# Patient Record
Sex: Female | Born: 1935 | Race: Black or African American | Hispanic: No | Marital: Married | Smoking: Former smoker
Health system: Southern US, Community
[De-identification: ages and names within clinical notes are randomized; demographics above are authoritative.]

## PROBLEM LIST (undated history)

## (undated) DIAGNOSIS — N189 Chronic kidney disease, unspecified: Secondary | ICD-10-CM

## (undated) DIAGNOSIS — I10 Essential (primary) hypertension: Secondary | ICD-10-CM

## (undated) DIAGNOSIS — J309 Allergic rhinitis, unspecified: Secondary | ICD-10-CM

## (undated) DIAGNOSIS — IMO0002 Reserved for concepts with insufficient information to code with codable children: Secondary | ICD-10-CM

## (undated) DIAGNOSIS — I739 Peripheral vascular disease, unspecified: Secondary | ICD-10-CM

## (undated) DIAGNOSIS — M1990 Unspecified osteoarthritis, unspecified site: Secondary | ICD-10-CM

## (undated) DIAGNOSIS — E559 Vitamin D deficiency, unspecified: Secondary | ICD-10-CM

## (undated) DIAGNOSIS — E119 Type 2 diabetes mellitus without complications: Secondary | ICD-10-CM

## (undated) DIAGNOSIS — I6529 Occlusion and stenosis of unspecified carotid artery: Secondary | ICD-10-CM

## (undated) DIAGNOSIS — I359 Nonrheumatic aortic valve disorder, unspecified: Secondary | ICD-10-CM

## (undated) DIAGNOSIS — E785 Hyperlipidemia, unspecified: Secondary | ICD-10-CM

## (undated) DIAGNOSIS — R229 Localized swelling, mass and lump, unspecified: Secondary | ICD-10-CM

## (undated) DIAGNOSIS — I517 Cardiomegaly: Secondary | ICD-10-CM

## (undated) HISTORY — DX: Nonrheumatic aortic valve disorder, unspecified: I35.9

## (undated) HISTORY — DX: Peripheral vascular disease, unspecified: I73.9

## (undated) HISTORY — DX: Essential (primary) hypertension: I10

## (undated) HISTORY — DX: Hyperlipidemia, unspecified: E78.5

## (undated) HISTORY — DX: Occlusion and stenosis of unspecified carotid artery: I65.29

## (undated) HISTORY — DX: Unspecified osteoarthritis, unspecified site: M19.90

## (undated) HISTORY — DX: Chronic kidney disease, unspecified: N18.9

## (undated) HISTORY — DX: Cardiomegaly: I51.7

## (undated) HISTORY — DX: Localized swelling, mass and lump, unspecified: R22.9

## (undated) HISTORY — DX: Vitamin D deficiency, unspecified: E55.9

## (undated) HISTORY — DX: Type 2 diabetes mellitus without complications: E11.9

## (undated) HISTORY — PX: ABDOMINAL HYSTERECTOMY: SHX81

## (undated) HISTORY — DX: Reserved for concepts with insufficient information to code with codable children: IMO0002

## (undated) HISTORY — DX: Allergic rhinitis, unspecified: J30.9

---

## 2012-04-21 LAB — PULMONARY FUNCTION TEST

## 2012-05-12 ENCOUNTER — Institutional Professional Consult (permissible substitution): Payer: Self-pay | Admitting: Pulmonary Disease

## 2012-05-25 ENCOUNTER — Encounter: Payer: Self-pay | Admitting: Pulmonary Disease

## 2012-05-26 ENCOUNTER — Encounter: Payer: Self-pay | Admitting: Pulmonary Disease

## 2012-05-26 ENCOUNTER — Ambulatory Visit (INDEPENDENT_AMBULATORY_CARE_PROVIDER_SITE_OTHER): Payer: Medicare Other | Admitting: Pulmonary Disease

## 2012-05-26 VITALS — BP 140/90 | HR 62 | Temp 97.8°F | Ht 62.0 in | Wt 156.0 lb

## 2012-05-26 DIAGNOSIS — I272 Pulmonary hypertension, unspecified: Secondary | ICD-10-CM | POA: Insufficient documentation

## 2012-05-26 DIAGNOSIS — I2789 Other specified pulmonary heart diseases: Secondary | ICD-10-CM

## 2012-05-26 NOTE — Patient Instructions (Addendum)
Your lung function is good We will review your echo test from [facility]

## 2012-05-26 NOTE — Assessment & Plan Note (Signed)
She does not seem to have intrinsic lung disease. PHT is mild on rpt echo , per [facility] notes, I am waiting on official report. At any rate, she is quite asymptomatic. She does have systemic hypertension & may have diastolic dysfunction on that basis. I do not feel that a right heart cath is indicated here. We should focus on controlling her Bp. In fact a screening sleep study may b eindicated since she does require multiple meds for hypertension.

## 2012-05-26 NOTE — Progress Notes (Signed)
  Subjective:    Patient ID: Whitney Ramirez, female    DOB: 08-02-1930, 79 y.o.   MRN: [id number]  HPI PCP - Lollie Marrow  79 y.o never smoker referred for evaluation of pulmonary hypertension. She does seem to have systemic hypertension that needs at least 4 medications to control. An echo suggested severe pulmonary hypertension but rpt echo in 2014  only showed mild pulmonary hypertension. She denies dyspnea on exertion, cough, wheeze or recurrent chest colds. In fact , she [place] in the mall about 1 mile daily am. She denies pedal edema, pND or orthopnea There is no personal or F/h/o thromboembolic disease Spirometry showed no airway obstruction with fev1 of 1.77 (116%) & FVC of 2.15 (104%) & ratio of 82   Past Medical History  Diagnosis Date  . Peripheral vascular disease   . Mass   . Aortic valve disorder   . Vitamin D deficiency   . Hypertension   . LVH (left ventricular hypertrophy)   . Occlusion and stenosis of carotid artery   . Hyperlipemia   . Allergic rhinitis   . Diabetes mellitus   . CKD (chronic kidney disease)     Past Surgical History  Procedure Laterality Date  . Abdominal hysterectomy      No Known Allergies  History   Social History  . Marital Status: Married    Spouse Name: N/A    Number of Children: N/A  . Years of Education: N/A   Occupational History  . Not on file.   Social History Main Topics  . Smoking status: Never Smoker   . Smokeless tobacco: Not on file  . Alcohol Use: No  . Drug Use: No  . Sexually Active: Not on file   Other Topics Concern  . Not on file   Social History Narrative  . No narrative on file    Family History  Problem Relation Age of Onset  . Heart disease Father   . Diabetes Father   . Diabetes Mother   . Hypertension Mother   . Hypertension Father      Review of Systems  Constitutional: Negative for fever and unexpected weight change.  HENT: Negative for ear pain, nosebleeds, congestion, sore  throat, rhinorrhea, sneezing, trouble swallowing, dental problem, postnasal drip and sinus pressure.   Eyes: Negative for redness and itching.  Respiratory: Negative for cough, chest tightness, shortness of breath and wheezing.   Cardiovascular: Negative for palpitations and leg swelling.  Gastrointestinal: Negative for nausea and vomiting.  Genitourinary: Negative for dysuria.  Musculoskeletal: Negative for joint swelling.  Skin: Negative for rash.  Neurological: Negative for headaches.  Hematological: Does not bruise/bleed easily.  Psychiatric/Behavioral: Negative for dysphoric mood. The patient is not nervous/anxious.        Objective:   Physical Exam Gen. Pleasant, well-nourished, in no distress, normal affect ENT - no lesions, no post nasal drip Neck: No JVD, no thyromegaly, no carotid bruits Lungs: no use of accessory muscles, no dullness to percussion, clear without rales or rhonchi  Cardiovascular: Rhythm regular, heart sounds  normal, no murmurs or gallops, no peripheral edema Abdomen: soft and non-tender, no hepatosplenomegaly, BS normal. Musculoskeletal: No deformities, no cyanosis or clubbing Neuro:  alert, non focal         Assessment & Plan:

## 2017-05-11 DIAGNOSIS — G4733 Obstructive sleep apnea (adult) (pediatric): Secondary | ICD-10-CM | POA: Insufficient documentation

## 2017-05-14 DIAGNOSIS — I480 Paroxysmal atrial fibrillation: Secondary | ICD-10-CM | POA: Insufficient documentation

## 2017-07-04 ENCOUNTER — Ambulatory Visit: Payer: Medicare Other | Admitting: Family Medicine

## 2017-07-04 ENCOUNTER — Encounter: Payer: Self-pay | Admitting: Family Medicine

## 2017-07-04 ENCOUNTER — Ambulatory Visit (INDEPENDENT_AMBULATORY_CARE_PROVIDER_SITE_OTHER): Payer: Medicare HMO | Admitting: Family Medicine

## 2017-07-04 VITALS — BP 160/68 | HR 73 | Temp 98.3°F | Resp 16 | Ht 62.0 in | Wt 139.0 lb

## 2017-07-04 DIAGNOSIS — I10 Essential (primary) hypertension: Secondary | ICD-10-CM | POA: Insufficient documentation

## 2017-07-04 DIAGNOSIS — N3941 Urge incontinence: Secondary | ICD-10-CM

## 2017-07-04 MED ORDER — LISINOPRIL 10 MG PO TABS: 10.0000 mg | ORAL_TABLET | Freq: Every day | ORAL | 3 refills | Status: DC

## 2017-07-04 MED ORDER — MIRABEGRON ER 25 MG PO TB24: 25.0000 mg | ORAL_TABLET | Freq: Every day | ORAL | 1 refills | Status: DC

## 2017-07-04 MED ORDER — LISINOPRIL 10 MG PO TABS: 10.0000 mg | ORAL_TABLET | Freq: Every day | ORAL | 0 refills | Status: DC

## 2017-07-04 NOTE — Patient Instructions (Addendum)
We will hold off on the urology referral for now.  If the medicine for your urinary issue is too expensive, don't fill it and let me know.   Consider a wrist cock up splint. This can be found at [facility] or online.   For now, we are changing your hydralazine to lisinopril. I think this can help with a lot of issues, but if I see anything in your records that would make [place] change this, I will let you know.   Let us know if you need anything.

## 2017-07-04 NOTE — Progress Notes (Signed)
Chief Complaint  Patient presents with  . New Patient (Initial Visit)    "Just wanting one doctor" needing urology referral  . Hypertension  . Hyperlipidemia       New Patient Visit SUBJECTIVE: HPI: Whitney Ramirez is an 84 y.o.female who is being seen for establishing care.  The patient was previously seen at [facility].  4-5 years, will have immediate urge to urinate and will lose control. She has been using pads.  She does not consume a copious amount of caffeine or alcohol.  She does not drink large amounts of fluids.  She denies any bleeding, pain, fevers, or discharge.  She has never been on any medication for this issue.  Hypertension Patient presents for hypertension follow up. She does monitor home blood pressures. Blood pressures ranging on average from 140's/60-70's. She is compliant with medication- hydralazine 50 mg tid, labetalol 100 mg/d. Patient has these side effects of medication: none (is having LE swelling) She is adhering to a healthy diet overall. Exercise: fishing, gardening   No Known Allergies  Past Medical History:  Diagnosis Date  . Allergic rhinitis   . Aortic valve disorder   . Arthritis   . CKD (chronic kidney disease)   . Diabetes mellitus (HCC)   . Hyperlipemia   . Hypertension   . LVH (left ventricular hypertrophy)   . Mass   . Occlusion and stenosis of carotid artery   . Peripheral vascular disease (HCC)   . Vitamin D deficiency    Past Surgical History:  Procedure Laterality Date  . ABDOMINAL HYSTERECTOMY     Family History  Problem Relation Age of Onset  . Heart disease Father   . Diabetes Father   . Hypertension Father   . Diabetes Mother   . Hypertension Mother    No Known Allergies  Current Outpatient Medications:  .  atorvastatin (LIPITOR) 40 MG tablet, Take by mouth., Disp: , Rfl:  .  Calcium Carbonate-Vitamin D (CALTRATE 600+D) 600-400 MG-UNIT per chew tablet, Chew 1 tablet by mouth 2 (two) times daily., Disp: ,  Rfl:  .  COD LIVER OIL PO, Take by mouth daily., Disp: , Rfl:  .  fish oil-omega-3 fatty acids 1000 MG capsule, Take 1 g by mouth daily., Disp: , Rfl:  .  labetalol (NORMODYNE) 100 MG tablet, Take by mouth., Disp: , Rfl:  .  Multiple Vitamins-Minerals (MULTIVITAMIN PO), Take by mouth daily., Disp: , Rfl:  .  omeprazole (PRILOSEC) 40 MG capsule, , Disp: , Rfl:  .  lisinopril (PRINIVIL,ZESTRIL) 10 MG tablet, Take 1 tablet (10 mg total) by mouth daily., Disp: 30 tablet, Rfl: 0 .  lisinopril (PRINIVIL,ZESTRIL) 10 MG tablet, Take 1 tablet (10 mg total) by mouth daily., Disp: 90 tablet, Rfl: 3 .  mirabegron ER (MYRBETRIQ) 25 MG TB24 tablet, Take 1 tablet (25 mg total) by mouth daily., Disp: 30 tablet, Rfl: 1   ROS Cardiovascular: Denies chest pain  Respiratory: Denies dyspnea   OBJECTIVE: BP (!) 160/68 (BP Location: Left Arm, Patient Position: Sitting, Cuff Size: Normal)   Pulse 73   Temp 98.3 F (36.8 C) (Oral)   Resp 16   Ht  (1.575 m)   Wt 139 lb (63 kg)   SpO2 98%   BMI 25.42 kg/m   Constitutional: -  VS reviewed -  Well developed, well nourished, appears stated age -  No apparent distress  Psychiatric: -  Oriented to person, place, and time -  Memory intact -  Affect and mood normal -  Fluent conversation, good eye contact -  Judgment and insight age appropriate  Eye: -  Conjunctivae clear, no discharge -  Pupils symmetric, round, reactive to light  ENMT: -  MMM    Pharynx moist, no exudate, no erythema  Neck: -  No gross swelling, no palpable masses -  Thyroid midline, not enlarged, mobile, no palpable masses  Cardiovascular: -  RRR -  + Bilateral 2+ pitting edema tapering to the proximal third of tibia bilaterally  Respiratory: -  Normal respiratory effort, no accessory muscle use, no retraction -  Breath sounds equal, no wheezes, no ronchi, no crackles  Musculoskeletal: -  No clubbing, no cyanosis -  Gait normal  Skin: -  No significant lesion on inspection -   Warm and dry to palpation   ASSESSMENT/PLAN: Urge incontinence of urine - Plan: mirabegron ER (MYRBETRIQ) 25 MG TB24 tablet  Essential hypertension - Plan: labetalol (NORMODYNE) 100 MG tablet, lisinopril (PRINIVIL,ZESTRIL) 10 MG tablet  Patient instructed to sign release of records form from her previous PCP. Start above medicine.  Let us know if the medicine is too expensive and we will see if we can find an alternative.  We can always do the referral in the future, she wishes to try this first. Stop hydralazine given the swelling in her lower extremities and preference for once a day medicine rather than 3 times a day.  Continue to check blood pressure at home.  Counseled on diet and exercise. Patient should return in 1 mo to check urinary issue.  Since we are changing her hydralazine to lisinopril, we will also follow-up on her blood pressure.  I would plan to see her in 3 months for a diabetes check pending review of her labs. The patient voiced understanding and agreement to the plan.   Jilda Roche Blomkest, DO 07/04/17  8:48 AM

## 2017-07-16 ENCOUNTER — Telehealth: Payer: Self-pay | Admitting: *Deleted

## 2017-07-16 DIAGNOSIS — D508 Other iron deficiency anemias: Secondary | ICD-10-CM

## 2017-07-16 NOTE — Addendum Note (Signed)
Addended by: Scharlene Gloss B on: 07/16/2017 12:28 PM   Modules accepted: Orders

## 2017-07-16 NOTE — Telephone Encounter (Signed)
Copied from CRM [id number]. Topic: Appointment Scheduling - Scheduling Inquiry for Clinic >> Jul 16, 2017 10:49 AM Landry Mellow wrote: Reason for CRM: annie legrand ( daughter) called - she is requesting to have a hemoglobin test ran. Pt will not have a test ran under Dr Nedra Hai care and the results will not be back for 2 weeks. Please call daughter if orders will be put in at [phone] and also call pt at [phone]

## 2017-07-16 NOTE — Telephone Encounter (Signed)
OK, dx anemia.

## 2017-07-16 NOTE — Telephone Encounter (Signed)
Lab ordered. Called the daughter left message/==did however call the patient and spoke to her/scheduled an appt for this lab in the morning 07/17/2017 at 10:30---daughter did call back and informed her of appt.

## 2017-07-17 ENCOUNTER — Other Ambulatory Visit (INDEPENDENT_AMBULATORY_CARE_PROVIDER_SITE_OTHER): Payer: Medicare HMO

## 2017-07-17 DIAGNOSIS — D508 Other iron deficiency anemias: Secondary | ICD-10-CM

## 2017-07-18 LAB — HEMOGLOBIN: Hemoglobin: 8.4 g/dL — ABNORMAL LOW (ref 12.0–15.0)

## 2017-07-18 NOTE — Telephone Encounter (Signed)
Daughter calling checking to see if results are back yet from Dr Nedra Hai, Call back [phone]

## 2017-07-21 ENCOUNTER — Other Ambulatory Visit: Payer: Self-pay

## 2017-07-21 ENCOUNTER — Ambulatory Visit: Payer: Self-pay | Admitting: *Deleted

## 2017-07-21 ENCOUNTER — Ambulatory Visit (HOSPITAL_BASED_OUTPATIENT_CLINIC_OR_DEPARTMENT_OTHER)
Admission: RE | Admit: 2017-07-21 | Discharge: 2017-07-21 | Disposition: A | Discharge status: Home / Self Care | Payer: Medicare HMO | Source: Ambulatory Visit | Attending: Internal Medicine | Admitting: Internal Medicine

## 2017-07-21 ENCOUNTER — Encounter: Payer: Self-pay | Admitting: Internal Medicine

## 2017-07-21 ENCOUNTER — Telehealth: Payer: Self-pay | Admitting: Family Medicine

## 2017-07-21 ENCOUNTER — Encounter: Payer: Self-pay | Admitting: *Deleted

## 2017-07-21 ENCOUNTER — Ambulatory Visit (INDEPENDENT_AMBULATORY_CARE_PROVIDER_SITE_OTHER): Payer: Medicare HMO | Admitting: Internal Medicine

## 2017-07-21 VITALS — BP 152/72 | HR 90 | Temp 98.9°F | Ht 62.0 in | Wt 136.2 lb

## 2017-07-21 DIAGNOSIS — I480 Paroxysmal atrial fibrillation: Secondary | ICD-10-CM | POA: Diagnosis not present

## 2017-07-21 DIAGNOSIS — D500 Iron deficiency anemia secondary to blood loss (chronic): Secondary | ICD-10-CM

## 2017-07-21 DIAGNOSIS — M7731 Calcaneal spur, right foot: Secondary | ICD-10-CM | POA: Insufficient documentation

## 2017-07-21 DIAGNOSIS — M7989 Other specified soft tissue disorders: Secondary | ICD-10-CM | POA: Insufficient documentation

## 2017-07-21 NOTE — Patient Instructions (Signed)
Please go downstairs and get your x-ray of the ankle.  You also need a ultrasound of the R leg  to be done today.  We will call you with the results  If you have chest pain, difficulty breathing, increased swelling, fever, chills, increased ankle pain: Please go to the ER

## 2017-07-21 NOTE — Telephone Encounter (Signed)
Pt's daughter given results per notes of Dr. Carmelia Roller on 07/19/17, daughter verbalized understanding. Unable to document in result note due to result note not being routed to [facility].

## 2017-07-21 NOTE — Telephone Encounter (Signed)
Called pt due to complaints of right leg and swelling which started is 07/20/17; she also reports pain to the area; recommendations made per nurse triage to include seeing a physician within 24 hours; pt normally sees Dr Carmelia Roller but he has no availability within parameters per protocol; pt offered and accepted appointment with Dr Drue Novel, [facility] 07/21/17 at 1500; she verbalizes understanding; will route to office for notification of this upcoming appointment.  Reason for Disposition . [1] MODERATE leg swelling (e.g., swelling extends up to knees) AND [2] new onset or worsening  Answer Assessment - Initial Assessment Questions 1. ONSET: "When did the swelling start?" (e.g., minutes, hours, days)     07/21/17 2. LOCATION: "What part of the leg is swollen?"  "Are both legs swollen or just one leg?"     right 3. SEVERITY: "How bad is the swelling?" (e.g., localized; mild, moderate, severe)  - Localized - small area of swelling localized to one leg  - MILD pedal edema - swelling limited to foot and ankle, pitting edema < 1/4 inch (6 mm) deep, rest and elevation eliminate most or all swelling  - MODERATE edema - swelling of lower leg to knee, pitting edema > 1/4 inch (6 mm) deep, rest and elevation only partially reduce swelling  - SEVERE edema - swelling extends above knee, facial or hand swelling present      moderate 4. REDNESS: "Does the swelling look red or infected?"     no 5. PAIN: "Is the swelling painful to touch?" If so, ask: "How painful is it?"   (Scale 1-10; mild, moderate or severe)     Mild to moderate 6. FEVER: "Do you have a fever?" If so, ask: "What is it, how was it measured, and when did it start?"     no 7. CAUSE: "What do you think is causing the leg swelling?"    Unsure; maybe on there too mucj 8. MEDICAL HISTORY: "Do you have a history of heart failure, kidney disease, liver failure, or cancer?"    hypertension 9. RECURRENT SYMPTOM: "Have you had leg swelling before?" If  so, ask: "When was the last time?" "What happened that time?"     no 10. OTHER SYMPTOMS: "Do you have any other symptoms?" (e.g., chest pain, difficulty breathing)       no 11. PREGNANCY: "Is there any chance you are pregnant?" "When was your last menstrual period?"       no  Protocols used: LEG SWELLING AND EDEMA-A-AH

## 2017-07-21 NOTE — Telephone Encounter (Signed)
Charted in error This encounter was created in error - please disregard. 

## 2017-07-21 NOTE — Telephone Encounter (Signed)
FYI

## 2017-07-21 NOTE — Progress Notes (Signed)
Pre visit review using our clinic review tool, if applicable. No additional management support is needed unless otherwise documented below in the visit note. 

## 2017-07-21 NOTE — Progress Notes (Signed)
Subjective:    Patient ID: Whitney Ramirez, female    DOB: 23-Apr-1930, 84 y.o.   MRN: [id number]  DOS:  07/21/2017 Type of visit - description : acute Interval history: Here with her daughter Her main concern is right leg swelling. The patient did have a somewhat prolonged car ride 07/18/2017, ~ 1.5 hours Developed right leg swelling without pain 07/20/2017. The swelling today is slightly worse. She was recently admitted to the hospital with severe anemia. I noticed some warmness of the right ankle, she denies fever or chills.  She has bilateral ankle pain as baseline   Review of Systems No fever chills Denies chest pain or difficulty breathing. No nausea, vomiting, diarrhea.  No recent blood in the stools.  Past Medical History:  Diagnosis Date  . Allergic rhinitis   . Aortic valve disorder   . Arthritis   . CKD (chronic kidney disease)   . Diabetes mellitus (HCC)   . Hyperlipemia   . Hypertension   . LVH (left ventricular hypertrophy)   . Mass   . Occlusion and stenosis of carotid artery   . Peripheral vascular disease (HCC)   . Vitamin D deficiency     Past Surgical History:  Procedure Laterality Date  . ABDOMINAL HYSTERECTOMY      Social History   Socioeconomic History  . Marital status: Married    Spouse name: Not on file  . Number of children: Not on file  . Years of education: Not on file  . Highest education level: Not on file  Occupational History  . Not on file  Social Needs  . Financial resource strain: Not on file  . Food insecurity:    Worry: Not on file    Inability: Not on file  . Transportation needs:    Medical: Not on file    Non-medical: Not on file  Tobacco Use  . Smoking status: Former Smoker    Types: Cigarettes  . Smokeless tobacco: Never Used  Substance and Sexual Activity  . Alcohol use: No  . Drug use: No  . Sexual activity: Not on file  Lifestyle  . Physical activity:    Days per week: Not on file    Minutes per session:  Not on file  . Stress: Not on file  Relationships  . Social connections:    Talks on phone: Not on file    Gets together: Not on file    Attends religious service: Not on file    Active member of club or organization: Not on file    Attends meetings of clubs or organizations: Not on file    Relationship status: Not on file  . Intimate partner violence:    Fear of current or ex partner: Not on file    Emotionally abused: Not on file    Physically abused: Not on file    Forced sexual activity: Not on file  Other Topics Concern  . Not on file  Social History Narrative  . Not on file      Allergies as of 07/21/2017   No Known Allergies     Medication List        Accurate as of 07/21/17 11:59 PM. Always use your most recent med list.          atorvastatin 40 MG tablet Commonly known as:  LIPITOR Take by mouth.   CALTRATE 600+D 600-400 MG-UNIT chew tablet Generic drug:  Calcium Carbonate-Vitamin D Chew 1 tablet by mouth 2 (two)  times daily.   COD LIVER OIL PO Take by mouth daily.   fish oil-omega-3 fatty acids 1000 MG capsule Take 1 g by mouth daily.   labetalol 100 MG tablet Commonly known as:  NORMODYNE Take by mouth.   lisinopril 10 MG tablet Commonly known as:  PRINIVIL,ZESTRIL Take 1 tablet (10 mg total) by mouth daily.   mirabegron ER 25 MG Tb24 tablet Commonly known as:  MYRBETRIQ Take 1 tablet (25 mg total) by mouth daily.   MULTIVITAMIN PO Take by mouth daily.   omeprazole 40 MG capsule Commonly known as:  PRILOSEC          Objective:   Physical Exam BP (!) 152/72 (BP Location: Left Arm, Patient Position: Sitting, Cuff Size: Normal)   Pulse 90   Temp 98.9 F (37.2 C) (Oral)   Ht  (1.575 m)   Wt 136 lb 4 oz (61.8 kg)   SpO2 96%   BMI 24.92 kg/m  General:   Well developed, well nourished man sitting in a wheelchair in no apparent distress HEENT:  Normocephalic . Face symmetric, atraumatic Lungs:  CTA B Normal respiratory  effort, no intercostal retractions, no accessory muscle use. Heart: Seems irregular .  Extremities: Right leg is larger: 35 versus 32 cm in diameter in the calf 26 versus 23 cm distal from the calf Calves are soft, nontender.  No redness or warmness. Ankles: Range of motion decreased bilaterally, slightly warm on the right.  No redness.  Not particularly TTP. Skin: Not pale. Not jaundice Neurologic:  alert & oriented X3.  Speech normal, gait not tested Psych--  Cognition and judgment appear intact.  Cooperative with normal attention span and concentration.  Behavior appropriate. No anxious or depressed appearing.      Assessment & Plan:   84 y/o female whose PMH include HTN, OSA, paroxysmal A. 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  Presents with:  Right leg edema: Acute, she did have a somewhat prolonged car ride 48 hours prior to the onset of the swelling.  Leg is indeed swollen but the calf is soft and nontender. Check a ultrasound, if it shows a DVT I will direct the patient to the ER for consideration of a inferior vena cava filter, I believe anticoagulation will be very risky in her situation. Addendum: [place] (-) for DVT, I personally communicated that to the patient.  Recommend leg elevation.  Call if swelling increases, or she has chest pain or difficulty breathing. Atrial fibrillation: Not anticoagulated Profound anemia: Follow-up by GI at [facility], her last hemoglobin was a stable.  Currently with no GI symptoms. Pt would like to transfer to [facility].  Will let PCP handle that issue.  Until then she must follow up with [facility]. Ankle pain: We will get an x-ray, joint is a slightly warm, no effusion, no fever chills.  Consider get a uric acid, gout? Of  note: I  was unable to review labs at care everywhere.  Today, I spent more than 40 min with the patient: >50% of the time counseling regards her acute problem which is lower extremity edema, also doing extensive chart review since she is a new patient to me. I also communicated personally the results of the ultrasound.

## 2017-07-23 DIAGNOSIS — D649 Anemia, unspecified: Secondary | ICD-10-CM | POA: Insufficient documentation

## 2017-08-04 ENCOUNTER — Ambulatory Visit: Payer: Medicare HMO | Admitting: Family Medicine

## 2017-08-13 ENCOUNTER — Ambulatory Visit (INDEPENDENT_AMBULATORY_CARE_PROVIDER_SITE_OTHER): Payer: Medicare HMO | Admitting: Family Medicine

## 2017-08-13 ENCOUNTER — Encounter: Payer: Self-pay | Admitting: Family Medicine

## 2017-08-13 VITALS — BP 172/70 | HR 78 | Temp 97.7°F | Ht 62.0 in | Wt 137.0 lb

## 2017-08-13 DIAGNOSIS — N3941 Urge incontinence: Secondary | ICD-10-CM

## 2017-08-13 DIAGNOSIS — R600 Localized edema: Secondary | ICD-10-CM | POA: Diagnosis not present

## 2017-08-13 DIAGNOSIS — D509 Iron deficiency anemia, unspecified: Secondary | ICD-10-CM

## 2017-08-13 DIAGNOSIS — I10 Essential (primary) hypertension: Secondary | ICD-10-CM

## 2017-08-13 MED ORDER — MIRABEGRON ER 25 MG PO TB24: 25.0000 mg | ORAL_TABLET | Freq: Every day | ORAL | 1 refills | Status: DC

## 2017-08-13 MED ORDER — LISINOPRIL 20 MG PO TABS: 20.0000 mg | ORAL_TABLET | Freq: Every day | ORAL | 1 refills | Status: DC

## 2017-08-13 NOTE — Progress Notes (Signed)
Chief Complaint  Patient presents with  . Follow-up    Subjective: Patient is a 84 y.o. female here for med f/u.  Patient was seen around 1 month ago and diagnosed with incontinence.  She had been on several anticholinergics, but had never been tried on Myrbetriq.  We tried to call it in, however she never picked it up.  She continues to have urinary frequency at night.  Hypertension Patient presents for hypertension follow up. She does monitor home blood pressures. Blood pressures ranging on average from 140's/60's. She is compliant with medications. Patient has these side effects of medication: none She is adhering to a healthy diet overall. Exercise: Not much exercise/physical activity recently  The patient has a history of iron deficiency anemia.  She was seeing gastroenterology, however after an unremarkable work-up, she was just seeing them for iron infusions.  She would [facility].  She feels much better since starting her infusions.  She continues to have swelling in her legs.  She was seen several weeks ago and had a negative work-up for DVT.  She was stopped on hydralazine.  No calf pain or shortness of breath.  She does not remember when her last echo was.  ROS: Heart: Denies chest pain  Lungs: Denies SOB   Family History  Problem Relation Age of Onset  . Heart disease Father   . Diabetes Father   . Hypertension Father   . Diabetes Mother   . Hypertension Mother    Past Medical History:  Diagnosis Date  . Allergic rhinitis   . Aortic valve disorder   . Arthritis   . CKD (chronic kidney disease)   . Diabetes mellitus (HCC)   . Hyperlipemia   . Hypertension   . LVH (left ventricular hypertrophy)   . Mass   . Occlusion and stenosis of carotid artery   . Peripheral vascular disease (HCC)   . Vitamin D deficiency    No Known Allergies  Current Outpatient Medications:  .  atorvastatin (LIPITOR) 40 MG tablet, Take by mouth., Disp: ,  Rfl:  .  Calcium Carbonate-Vitamin D (CALTRATE 600+D) 600-400 MG-UNIT per chew tablet, Chew 1 tablet by mouth 2 (two) times daily., Disp: , Rfl:  .  COD LIVER OIL PO, Take by mouth daily., Disp: , Rfl:  .  fish oil-omega-3 fatty acids 1000 MG capsule, Take 1 g by mouth daily., Disp: , Rfl:  .  labetalol (NORMODYNE) 100 MG tablet, Take by mouth., Disp: , Rfl:  .  lisinopril (PRINIVIL,ZESTRIL) 20 MG tablet, Take 1 tablet (20 mg total) by mouth daily., Disp: 90 tablet, Rfl: 1 .  mirabegron ER (MYRBETRIQ) 25 MG TB24 tablet, Take 1 tablet (25 mg total) by mouth daily., Disp: 30 tablet, Rfl: 1 .  Multiple Vitamins-Minerals (MULTIVITAMIN PO), Take by mouth daily., Disp: , Rfl:  .  omeprazole (PRILOSEC) 40 MG capsule, , Disp: , Rfl:   Objective: BP (!) 172/70 (BP Location: Left Arm, Patient Position: Sitting, Cuff Size: Normal)   Pulse 78   Temp 97.7 F (36.5 C) (Oral)   Ht 5\' 2"  (1.575 m)   Wt 137 lb (62.1 kg)   SpO2 99%   BMI 25.06 kg/m  General: Awake, appears stated age HEENT: MMM, EOMi Heart: RRR, 2+ LE edema b/l Lungs: CTAB, no rales, wheezes or rhonchi. No accessory muscle use Abd: BS+, soft, NT, ND, no masses or organomegaly Psych: Age appropriate judgment and insight, normal affect and mood  Assessment  and Plan: Essential hypertension - Plan: ECHOCARDIOGRAM COMPLETE, lisinopril (PRINIVIL,ZESTRIL) 20 MG tablet  Urge incontinence of urine - Plan: mirabegron ER (MYRBETRIQ) 25 MG TB24 tablet  Iron deficiency anemia, unspecified iron deficiency anemia type - Plan: Ambulatory referral to Hematology  Bilateral leg edema - Plan: ECHOCARDIOGRAM COMPLETE  Orders as above. Increase dose of ACEi from 10 mg/d to 20 mg/d. Ck Echo. Call in Myrbetriq again. Refer to [facility] hematology for Fe infusions per pt request. F/u in 6 weeks to reck BP.  The patient voiced understanding and agreement to the plan.  Jilda Roche Fleming, DO 08/13/17  12:36 PM

## 2017-08-13 NOTE — Patient Instructions (Addendum)
OK to start exercising/getting physically active again. Elevate the legs. Mind the salt intake.   If you do not hear anything about your referral in the next 1-2 weeks, call our office and ask for an update.  Continue checking blood pressure at home.  [facility] too [facility], let me know.  Let us know if you need anything.

## 2017-08-13 NOTE — Progress Notes (Signed)
Pre visit review using our clinic review tool, if applicable. No additional management support is needed unless otherwise documented below in the visit note. 

## 2017-08-14 ENCOUNTER — Other Ambulatory Visit: Payer: Self-pay | Admitting: Family Medicine

## 2017-08-14 DIAGNOSIS — N3941 Urge incontinence: Secondary | ICD-10-CM

## 2017-08-14 MED ORDER — MIRABEGRON ER 25 MG PO TB24: 25.0000 mg | ORAL_TABLET | Freq: Every day | ORAL | 1 refills | Status: DC

## 2017-08-14 NOTE — Progress Notes (Signed)
[facility] called to mail order. Pt notified.

## 2017-08-28 ENCOUNTER — Ambulatory Visit (HOSPITAL_BASED_OUTPATIENT_CLINIC_OR_DEPARTMENT_OTHER)
Admission: RE | Admit: 2017-08-28 | Discharge: 2017-08-28 | Disposition: A | Discharge status: Home / Self Care | Payer: Medicare HMO | Source: Ambulatory Visit | Attending: Family Medicine | Admitting: Family Medicine

## 2017-08-28 DIAGNOSIS — I10 Essential (primary) hypertension: Secondary | ICD-10-CM

## 2017-08-28 DIAGNOSIS — I700 Atherosclerosis of aorta: Secondary | ICD-10-CM | POA: Insufficient documentation

## 2017-08-28 DIAGNOSIS — I119 Hypertensive heart disease without heart failure: Secondary | ICD-10-CM | POA: Diagnosis not present

## 2017-08-28 DIAGNOSIS — I348 Other nonrheumatic mitral valve disorders: Secondary | ICD-10-CM | POA: Insufficient documentation

## 2017-08-28 DIAGNOSIS — R609 Edema, unspecified: Secondary | ICD-10-CM | POA: Insufficient documentation

## 2017-08-28 DIAGNOSIS — I4891 Unspecified atrial fibrillation: Secondary | ICD-10-CM | POA: Diagnosis not present

## 2017-08-28 DIAGNOSIS — R600 Localized edema: Secondary | ICD-10-CM

## 2017-09-11 ENCOUNTER — Other Ambulatory Visit (HOSPITAL_BASED_OUTPATIENT_CLINIC_OR_DEPARTMENT_OTHER): Payer: Medicare HMO

## 2017-09-18 ENCOUNTER — Inpatient Hospital Stay: Payer: Medicare HMO

## 2017-09-18 ENCOUNTER — Inpatient Hospital Stay: Payer: Medicare HMO | Attending: Hematology & Oncology | Admitting: Hematology & Oncology

## 2017-09-24 ENCOUNTER — Other Ambulatory Visit: Payer: Self-pay

## 2017-09-24 ENCOUNTER — Ambulatory Visit (INDEPENDENT_AMBULATORY_CARE_PROVIDER_SITE_OTHER): Payer: Medicare HMO | Admitting: Family Medicine

## 2017-09-24 VITALS — BP 137/71 | HR 136

## 2017-09-24 DIAGNOSIS — I10 Essential (primary) hypertension: Secondary | ICD-10-CM | POA: Diagnosis not present

## 2017-09-24 MED ORDER — LABETALOL HCL 100 MG PO TABS: 100.0000 mg | ORAL_TABLET | Freq: Once | ORAL | 0 refills | Status: DC

## 2017-09-24 MED ORDER — LABETALOL HCL 100 MG PO TABS: 100.0000 mg | ORAL_TABLET | Freq: Once | ORAL | 1 refills | Status: DC

## 2017-09-24 NOTE — Progress Notes (Signed)
Pre visit review using our clinic tool,if applicable. No additional management support is needed unless otherwise documented below in the visit note.   Pt here for Blood pressure check per order from Dr. Carmelia RollerWendling.  Pt currently takes: Lisinopril 20 mg daily. Lebatelol 100 mg daily ordered however patient has not been taking. Ordered from pharmacy so patient can start. No complaints voiced today.   BP today  = 137/71 P = 136  Pt advised per restart Lebatelol 100 mg and continue Lisinopril 20 mg. Return for office visit with provider in 1 month. Appointment scheduled.

## 2017-09-30 NOTE — Progress Notes (Signed)
Noted. Agree with above.  Jilda Rocheicholas Paul [place], DO 09/30/17 3:17 PM

## 2017-10-01 ENCOUNTER — Other Ambulatory Visit: Payer: Self-pay | Admitting: Family Medicine

## 2017-10-01 MED ORDER — ATORVASTATIN CALCIUM 40 MG PO TABS: 40.0000 mg | ORAL_TABLET | Freq: Every day | ORAL | 1 refills | Status: DC

## 2017-10-06 ENCOUNTER — Telehealth: Payer: Self-pay | Admitting: Family Medicine

## 2017-10-06 NOTE — Telephone Encounter (Signed)
Author phoned pt. to clarify labetelol usage, no answer. Author left VM asking for return phone call.

## 2017-10-06 NOTE — Telephone Encounter (Signed)
Patient requesting refill on labetalol (NORMODYNE) 100 MG tablet [[id number]][[id number]] ENDED  Please advise.

## 2017-10-06 NOTE — Telephone Encounter (Signed)
Pt. Requesting labetelol refill. Last refilled 7/24 #90, 1 RF, but medication apparently expired, and order written as only for one dose. Routed to Dr. Carmelia RollerWendling to clarify and modify order.

## 2017-10-06 NOTE — Telephone Encounter (Signed)
Please verify once more that pt was taking daily as it was documented she was not on 7/24. If so, OK to call in 90 d supply w 1 ref. TY.

## 2017-10-23 ENCOUNTER — Encounter: Payer: Self-pay | Admitting: Family Medicine

## 2017-10-23 ENCOUNTER — Ambulatory Visit (INDEPENDENT_AMBULATORY_CARE_PROVIDER_SITE_OTHER): Payer: Medicare HMO | Admitting: Family Medicine

## 2017-10-23 VITALS — BP 146/76 | HR 94 | Temp 98.2°F | Ht 62.0 in | Wt 128.2 lb

## 2017-10-23 DIAGNOSIS — I10 Essential (primary) hypertension: Secondary | ICD-10-CM | POA: Diagnosis not present

## 2017-10-23 DIAGNOSIS — H6123 Impacted cerumen, bilateral: Secondary | ICD-10-CM | POA: Insufficient documentation

## 2017-10-23 DIAGNOSIS — Z8639 Personal history of other endocrine, nutritional and metabolic disease: Secondary | ICD-10-CM

## 2017-10-23 LAB — COMPREHENSIVE METABOLIC PANEL
ALT: 22 U/L (ref 0–35)
AST: 23 U/L (ref 0–37)
Albumin: 3.4 g/dL — ABNORMAL LOW (ref 3.5–5.2)
Alkaline Phosphatase: 97 U/L (ref 39–117)
BUN: 29 mg/dL — AB (ref 6–23)
CO2: 26 meq/L (ref 19–32)
Calcium: 9.8 mg/dL (ref 8.4–10.5)
Chloride: 103 mEq/L (ref 96–112)
Creatinine, Ser: 0.9 mg/dL (ref 0.40–1.20)
GFR: 76.58 mL/min (ref >60.00)
GLUCOSE: 90 mg/dL (ref 70–99)
POTASSIUM: 4 meq/L (ref 3.5–5.1)
SODIUM: 138 meq/L (ref 135–145)
Total Bilirubin: 0.2 mg/dL (ref 0.2–1.2)
Total Protein: 7.5 g/dL (ref 6.0–8.3)

## 2017-10-23 LAB — HEMOGLOBIN A1C: Hgb A1c MFr Bld: 5.6 % (ref 4.6–6.5)

## 2017-10-23 MED ORDER — LISINOPRIL 20 MG PO TABS: 20.0000 mg | ORAL_TABLET | Freq: Every day | ORAL | 0 refills | Status: DC

## 2017-10-23 MED ORDER — LABETALOL HCL 100 MG PO TABS
100.0000 mg | ORAL_TABLET | Freq: Once | ORAL | 0 refills | Status: DC
Start: 2017-10-23 — End: 2017-10-29

## 2017-10-23 NOTE — Patient Instructions (Signed)
Give us 2-3 business days to get the results of your labs back.   OK to use Debrox (peroxide) in the ear to loosen up wax. Also recommend using a bulb syringe (for removing boogers from baby's noses) to flush through warm water. Do not use Q-tips as this can impact wax further.  Stay active. Keep the diet clean.  Let us know if you need anything.

## 2017-10-23 NOTE — Progress Notes (Signed)
Pre visit review using our clinic review tool, if applicable. No additional management support is needed unless otherwise documented below in the visit note. 

## 2017-10-23 NOTE — Progress Notes (Signed)
Chief Complaint  Patient presents with  . Follow-up    Subjective Whitney Ramirez is a 84 y.o. female who presents for hypertension follow up. She does not monitor home blood pressures. She is compliant with medication- lisinopril 20 mg/d. Patient has these side effects of medication: none She is adhering to a healthy diet overall. Current exercise: gets around with a walker  +questionable hx of DM.   Feels like ears are clogged again. Hx of wax impaction.    Past Medical History:  Diagnosis Date  . Allergic rhinitis   . Aortic valve disorder   . Arthritis   . CKD (chronic kidney disease)   . Diabetes mellitus (HCC)   . Hyperlipemia   . Hypertension   . LVH (left ventricular hypertrophy)   . Mass   . Occlusion and stenosis of carotid artery   . Peripheral vascular disease (HCC)   . Vitamin D deficiency     Review of Systems Cardiovascular: no chest pain Respiratory:  no shortness of breath  Exam BP (!) 146/76 (BP Location: Left Arm, Patient Position: Sitting, Cuff Size: Normal)   Pulse 94   Temp 98.2 F (36.8 C) (Oral)   Ht 5\' 2"  (1.575 m)   Wt 128 lb 4 oz (58.2 kg)   SpO2 98%   BMI 23.46 kg/m  General:  well developed, well nourished, in no apparent distress Heart: RRR, no bruits, no LE edema Ears: 100% obstructed w cerumen b/l; patent and without lesion, TM's neg after procedure Lungs: clear to auscultation, no accessory muscle use Psych: well oriented with normal range of affect and appropriate judgment/insight  Procedure note; ear wax removal Verbal consent obtained.   Initially we tried to irrigate the ear but this was unsuccessful. A clear speculum with a light source was used to remove a large amount of wax in both ears.   The patient reported immediate improvement. She tolerated this well and no immediate complications were noted.  Essential hypertension - Plan: lisinopril (PRINIVIL,ZESTRIL) 20 MG tablet, labetalol (NORMODYNE) 100 MG tablet,  Comprehensive metabolic panel  History of diabetes mellitus - Plan: Hemoglobin A1c, CANCELED: CBC  Bilateral impacted cerumen  Orders as above. Counseled on diet and exercise. F/u in 6 mo. The patient voiced understanding and agreement to the plan.  Jilda Rocheicholas Paul [place], DO 10/23/17  10:40 AM

## 2017-10-27 ENCOUNTER — Encounter: Payer: Self-pay | Admitting: Family Medicine

## 2017-10-27 ENCOUNTER — Ambulatory Visit (INDEPENDENT_AMBULATORY_CARE_PROVIDER_SITE_OTHER): Payer: Medicare HMO | Admitting: Family Medicine

## 2017-10-27 VITALS — BP 148/82 | HR 98 | Temp 99.0°F | Ht 62.0 in | Wt 125.1 lb

## 2017-10-27 DIAGNOSIS — H6121 Impacted cerumen, right ear: Secondary | ICD-10-CM

## 2017-10-27 NOTE — Progress Notes (Signed)
Chief Complaint  Patient presents with  . Ear Fullness    right ear unable to hear    Subjective: Patient is a 84 y.o. female here for ear fullness.  Patient was here last week and had bilateral cerumen removal.  She feels that her right ear was neglected and is having fullness.  She is having difficulty hearing also.  No pain or drainage.   ROS: HEENT: As noted in HPI  Past Medical History:  Diagnosis Date  . Allergic rhinitis   . Aortic valve disorder   . Arthritis   . CKD (chronic kidney disease)   . Diabetes mellitus (HCC)   . Hyperlipemia   . Hypertension   . LVH (left ventricular hypertrophy)   . Mass   . Occlusion and stenosis of carotid artery   . Peripheral vascular disease (HCC)   . Vitamin D deficiency     Objective: BP (!) 148/82 (BP Location: Left Arm, Patient Position: Sitting, Cuff Size: Normal)   Pulse 98   Temp 99 F (37.2 C) (Oral)   Ht 5\' 2"  (1.575 m)   Wt 125 lb 2 oz (56.8 kg)   SpO2 96%   BMI 22.89 kg/m  General: Awake, appears stated age HEENT: Left canal is patent, TM is unremarkable; there is some earwax in the right canal, TM is unremarkable; after irrigation, canal is fully patent Lungs: CTAB, no rales, wheezes or rhonchi. No accessory muscle use Psych: Age appropriate judgment and insight, normal affect and mood  Procedure note; ear irrigation, right Verbal consent obtained A mixture of warm water and Dulcolax was irrigated into the right ear, performed by Zella Ballobin Ewing, CMA 2 pieces of cerumen were removed The patient reported immediate improvement She tolerated the procedure well There were no immediate complications noted  Assessment and Plan: Impacted cerumen of right ear  Fullness has resolved despite absence of 100% obstruction. Over-the-counter earwax removal tips written in AVS. Follow-up as needed. The patient voiced understanding and agreement to the plan.  Jilda Rocheicholas Paul [place], DO 10/27/17  4:28 PM

## 2017-10-27 NOTE — Patient Instructions (Signed)
OK to use Debrox (peroxide) in the ear to loosen up wax. Also recommend using a bulb syringe (for removing boogers from baby's noses) to flush through warm water. Do not use Q-tips as this can impact wax further.  Your ears are both fully clear as of now.   Let us know if you need anything.

## 2017-10-27 NOTE — Progress Notes (Signed)
Pre visit review using our clinic review tool, if applicable. No additional management support is needed unless otherwise documented below in the visit note. 

## 2017-10-29 ENCOUNTER — Telehealth: Payer: Self-pay

## 2017-10-29 DIAGNOSIS — I10 Essential (primary) hypertension: Secondary | ICD-10-CM

## 2017-10-29 MED ORDER — LABETALOL HCL 100 MG PO TABS: 100.0000 mg | ORAL_TABLET | Freq: Two times a day (BID) | ORAL | 0 refills | Status: DC

## 2017-10-29 NOTE — Telephone Encounter (Signed)
Chartered loss adjusterAuthor received labetelol clarification fax from [facility], stating that [facility] cannot fill as prescribed (missing dosing frequency per fax). Last ordered 8/22 #90, with end date of 8/22. Author unsure if provider wants pt. to continue taking. Routed to Dr. Carmelia RollerWendling and Zella Ballobin, CMA to review.

## 2017-10-29 NOTE — Addendum Note (Signed)
Addended by: Scharlene GlossEWING, ROBIN B on: 10/29/2017 01:07 PM   Modules accepted: Orders

## 2017-10-29 NOTE — Telephone Encounter (Signed)
Yes. Sig is one tab twice daily. Ty.

## 2017-10-29 NOTE — Telephone Encounter (Signed)
[facility] and [facility]

## 2017-12-04 ENCOUNTER — Ambulatory Visit (INDEPENDENT_AMBULATORY_CARE_PROVIDER_SITE_OTHER): Payer: Medicare HMO

## 2017-12-04 DIAGNOSIS — Z23 Encounter for immunization: Secondary | ICD-10-CM | POA: Diagnosis not present

## 2018-01-06 ENCOUNTER — Other Ambulatory Visit: Payer: Self-pay | Admitting: Family Medicine

## 2018-01-06 DIAGNOSIS — I10 Essential (primary) hypertension: Secondary | ICD-10-CM

## 2018-01-23 ENCOUNTER — Encounter: Payer: Self-pay | Admitting: Family Medicine

## 2018-01-23 ENCOUNTER — Ambulatory Visit (INDEPENDENT_AMBULATORY_CARE_PROVIDER_SITE_OTHER): Payer: Medicare HMO | Admitting: Family Medicine

## 2018-01-23 VITALS — BP 124/80 | HR 89 | Temp 98.3°F | Ht 62.0 in | Wt 146.1 lb

## 2018-01-23 DIAGNOSIS — M7989 Other specified soft tissue disorders: Secondary | ICD-10-CM | POA: Diagnosis not present

## 2018-01-23 LAB — COMPREHENSIVE METABOLIC PANEL
ALT: 10 U/L (ref 0–35)
AST: 17 U/L (ref 0–37)
Albumin: 3.1 g/dL — ABNORMAL LOW (ref 3.5–5.2)
Alkaline Phosphatase: 62 U/L (ref 39–117)
BILIRUBIN TOTAL: 0.4 mg/dL (ref 0.2–1.2)
BUN: 18 mg/dL (ref 6–23)
CO2: 24 meq/L (ref 19–32)
CREATININE: 0.95 mg/dL (ref 0.40–1.20)
Calcium: 9 mg/dL (ref 8.4–10.5)
Chloride: 106 mEq/L (ref 96–112)
GFR: 71.91 mL/min (ref >60.00)
GLUCOSE: 100 mg/dL — AB (ref 70–99)
Potassium: 3.9 mEq/L (ref 3.5–5.1)
Sodium: 141 mEq/L (ref 135–145)
Total Protein: 6.4 g/dL (ref 6.0–8.3)

## 2018-01-23 LAB — TSH: TSH: 2.37 u[IU]/mL (ref 0.35–4.50)

## 2018-01-23 MED ORDER — FUROSEMIDE 40 MG PO TABS: 40.0000 mg | ORAL_TABLET | Freq: Every day | ORAL | 3 refills | Status: DC

## 2018-01-23 MED ORDER — POTASSIUM CHLORIDE ER 10 MEQ PO TBCR: 20.0000 meq | EXTENDED_RELEASE_TABLET | Freq: Every day | ORAL | 1 refills | Status: DC

## 2018-01-23 NOTE — Patient Instructions (Signed)
Mind the salt intake, stay active, elevate legs.  We will be in touch regarding your labs.  Let us know if you need anything.

## 2018-01-23 NOTE — Progress Notes (Signed)
Pre visit review using our clinic review tool, if applicable. No additional management support is needed unless otherwise documented below in the visit note. 

## 2018-01-23 NOTE — Progress Notes (Signed)
Chief Complaint  Patient presents with  . Edema    Whitney Ramirez here for bilateral leg swelling.  Duration: 2 weeks Hx of prolonged bedrest, recent surgery, travel or injury? No Pain the calf? No SOB? No Personal or family history of clot or bleeding disorder? No Hx of heart failure, renal failure, hepatic failure? No  Is gaining weight since this started.  ROS:  MSK- +leg swelling, no pain Lungs- no SOB  Past Medical History:  Diagnosis Date  . Allergic rhinitis   . Aortic valve disorder   . Arthritis   . CKD (chronic kidney disease)   . Diabetes mellitus (HCC)   . Hyperlipemia   . Hypertension   . LVH (left ventricular hypertrophy)   . Mass   . Occlusion and stenosis of carotid artery   . Peripheral vascular disease (HCC)   . Vitamin D deficiency    BP 124/80 (BP Location: Left Arm, Patient Position: Sitting, Cuff Size: Normal)   Pulse 89   Temp 98.3 F (36.8 C) (Oral)   Ht 5\' 2"  (1.575 m)   Wt 146 lb 2 oz (66.3 kg)   SpO2 96%   BMI 26.73 kg/m  Gen- awake, alert, appears stated age Heart- RRR, no murmurs, 3+ LE edema pitting laterally Lungs- CTAB, normal effort w/o accessory muscle use MSK- no calf pain on palpation Psych: Age appropriate judgment and insight  Localized swelling of both lower extremities - Plan: TSH, Comprehensive metabolic panel, furosemide (LASIX) 40 MG tablet, potassium chloride (KLOR-CON 10) 10 MEQ tablet  Start Lasix with supplemental potassium.  Check labs.  Given her heart history, I believe she may benefit from diuresis. F/u next week for BMP, 4 weeks with me. Pt voiced understanding and agreement to the plan.  Whitney Rocheicholas Paul [place], DO 01/23/18  11:59 AM

## 2018-01-30 ENCOUNTER — Telehealth: Payer: Self-pay | Admitting: Family Medicine

## 2018-01-30 NOTE — Telephone Encounter (Signed)
Copied from CRM [phone]. Topic: Quick Communication - Home Health Verbal Orders >> Jan 30, 2018  4:37 PM Marylen PontoMcneil, Ja-Kwan wrote: Caller/Agency: Melanie with [facility]  Callback Number: [phone] Requesting OT/PT/Skilled Nursing/Social Work: PT also requests Home Health Aide  Frequency: 2 times a week for 4 weeks and 1 time a week for 2 weeks  OT evaluation and Nursing evaluation. Melanie also requests home health aide for bathing and dressing assistance 2 times a week for 3 weeks

## 2018-02-02 ENCOUNTER — Telehealth: Payer: Self-pay | Admitting: Family Medicine

## 2018-02-02 NOTE — Telephone Encounter (Signed)
Called [facility] already to inform ok per PCP

## 2018-02-02 NOTE — Telephone Encounter (Signed)
Copied from CRM [id number]. Topic: Quick Communication - Home Health Verbal Orders >> Jan 30, 2018  4:37 PM Marylen PontoMcneil, Ja-Kwan wrote: Caller/Agency: Melanie with [facility]  Callback Number: [phone] Requesting OT/PT/Skilled Nursing/Social Work: PT also requests Home Health Aide  Frequency: 2 times a week for 4 weeks and 1 time a week for 2 weeks  OT evaluation and Nursing evaluation. Shawna OrleansMelanie also requests home health aide for bathing and dressing assistance 2 times a week for 3 weeks >> Jan 30, 2018  4:50 PM Marylen PontoMcneil, Ja-Kwan wrote: Shawna OrleansMelanie with [facility] called to revise verbal order request for Physical Therapy to 2 times a week for 3 weeks and 1 time a week for 2 weeks. Cb# [phone]  CALLED [facility] INFORMED OF PCP VERBAL OK.

## 2018-02-05 ENCOUNTER — Telehealth: Payer: Self-pay | Admitting: Family Medicine

## 2018-02-05 NOTE — Telephone Encounter (Signed)
Copied from CRM [id number]. Topic: Quick Communication - Home Health Verbal Orders >> Feb 05, 2018 12:48 PM Leafy Roobinson, Norma J wrote: [facility]Ag[facility]: [phone] Requesting Skilled Nursing 1x5 for edema management

## 2018-02-09 NOTE — Telephone Encounter (Signed)
[facility] verbal ok per request.

## 2018-02-18 ENCOUNTER — Telehealth: Payer: Self-pay | Admitting: *Deleted

## 2018-02-18 NOTE — Telephone Encounter (Signed)
Received Physician Orders from [facility]; forwarded to provider/SLS 12/18  

## 2018-02-19 ENCOUNTER — Telehealth: Payer: Self-pay | Admitting: *Deleted

## 2018-02-19 NOTE — Telephone Encounter (Signed)
[facility]; forwarded to provider/SLS 12/19

## 2018-02-23 ENCOUNTER — Other Ambulatory Visit: Payer: Self-pay | Admitting: Family Medicine

## 2018-02-26 ENCOUNTER — Telehealth: Payer: Self-pay | Admitting: *Deleted

## 2018-02-26 NOTE — Telephone Encounter (Signed)
Received Physician Orders from [facility]; forwarded to provider/SLS 12/26  

## 2018-03-05 ENCOUNTER — Other Ambulatory Visit: Payer: Self-pay | Admitting: Family Medicine

## 2018-03-05 DIAGNOSIS — I10 Essential (primary) hypertension: Secondary | ICD-10-CM

## 2018-03-13 ENCOUNTER — Telehealth: Payer: Self-pay | Admitting: Family Medicine

## 2018-03-13 DIAGNOSIS — I739 Peripheral vascular disease, unspecified: Secondary | ICD-10-CM

## 2018-03-13 NOTE — Telephone Encounter (Signed)
Home health referral done

## 2018-03-13 NOTE — Telephone Encounter (Signed)
Copied from CRM [id number]. Topic: Quick Communication - See Telephone Encounter >> Mar 13, 2018  1:12 PM Jens Som A wrote: CRM for notification. See Telephone encounter for: 03/13/18.  Patient's Daughter is requesting a home health order to see if some one can come to the home an help out. Patient daughter's is seeing if there is a Child psychotherapist that helps out with this. Please advise [phone]

## 2018-03-13 NOTE — Telephone Encounter (Signed)
OK to place order. Dx peripheral arterial disease, physical deconditioning. TY.

## 2018-03-16 ENCOUNTER — Ambulatory Visit (INDEPENDENT_AMBULATORY_CARE_PROVIDER_SITE_OTHER): Payer: Medicare HMO | Admitting: Family Medicine

## 2018-03-16 ENCOUNTER — Other Ambulatory Visit: Payer: Self-pay | Admitting: Family Medicine

## 2018-03-16 ENCOUNTER — Encounter: Payer: Self-pay | Admitting: Family Medicine

## 2018-03-16 VITALS — BP 132/60 | HR 70 | Temp 97.8°F | Wt 143.4 lb

## 2018-03-16 DIAGNOSIS — G3184 Mild cognitive impairment, so stated: Secondary | ICD-10-CM | POA: Diagnosis not present

## 2018-03-16 DIAGNOSIS — I10 Essential (primary) hypertension: Secondary | ICD-10-CM

## 2018-03-16 MED ORDER — DONEPEZIL HCL 5 MG PO TABS: 5.0000 mg | ORAL_TABLET | Freq: Every day | ORAL | 2 refills | Status: DC

## 2018-03-16 NOTE — Patient Instructions (Signed)
Take new medicine daily.  Stay mentally active doing puzzles, reading, etc.  Let us know if you need anything.

## 2018-03-16 NOTE — Progress Notes (Signed)
Chief Complaint  Patient presents with  . Memory issues    She feels like she forgets things quite a bit. Mother, brother, Sister had Dementia.    Subjective: Patient is a 85 y.o. female here for forgetful. Here w daughter.   +Famhx of dementia in sister and mom. She will repeat things and sometimes get confused. Has been noticeably worsening over past year. No behavioral disturbance. Having trouble managing finances and doing bills. Do not want extensive work up but would like something that could potentially slow progression.   ROS: Neuro: As noted in HPI Psych: No depression  Past Medical History:  Diagnosis Date  . Allergic rhinitis   . Aortic valve disorder   . Arthritis   . CKD (chronic kidney disease)   . Diabetes mellitus (HCC)   . Hyperlipemia   . Hypertension   . LVH (left ventricular hypertrophy)   . Mass   . Occlusion and stenosis of carotid artery   . Peripheral vascular disease (HCC)   . Vitamin D deficiency     Objective: BP 132/60 (BP Location: Left Arm, Patient Position: Sitting, Cuff Size: Normal)   Pulse 70   Temp 97.8 F (36.6 C) (Oral)   Wt 143 lb 6.4 oz (65 kg)   SpO2 98%   BMI 26.23 kg/m  General: Awake, appears stated age HEENT: MMM Heart: RRR, 1+ pitting LE edema b/l Lungs: CTAB, no rales, wheezes or rhonchi. No accessory muscle use Psych: Normal affect and mood  Assessment and Plan: Mild cognitive impairment - Plan: donepezil (ARICEPT) 5 MG tablet  Start Aricept.  F/u in 1 mo.  The patient voiced understanding and agreement to the plan.  Jilda Roche [place], DO 03/16/18  12:08 PM

## 2018-03-17 ENCOUNTER — Telehealth: Payer: Self-pay | Admitting: *Deleted

## 2018-03-17 NOTE — Telephone Encounter (Signed)
Received Home Health Certification and Plan of Care; forwarded to provider/SLS 01/14  

## 2018-03-18 NOTE — Telephone Encounter (Signed)
[facility] with [facility] calling to advise she saw the pt yesterday and pt is doing well. Pt was just discharged from home health on 03/05/2018.  Whitney Ramirez states pt is doing even better now, and there is not much else she can do for the pt. Whitney Ramirez states the bottom line is the family wants someone to come in and help pt with her bath and get her dressed.  Pt does not qualify for medicaid, and that is the only way pt can get a personal aid.  She states she has had this conversation with the family, so they are aware. Whitney Ramirez states nothing further to assist the pt with at this time. cb [phone]

## 2018-03-18 NOTE — Telephone Encounter (Signed)
FYI

## 2018-04-02 ENCOUNTER — Telehealth: Payer: Self-pay | Admitting: Family Medicine

## 2018-04-02 DIAGNOSIS — M7989 Other specified soft tissue disorders: Secondary | ICD-10-CM

## 2018-04-02 MED ORDER — POTASSIUM CHLORIDE ER 10 MEQ PO TBCR: 20.0000 meq | EXTENDED_RELEASE_TABLET | Freq: Every day | ORAL | 0 refills | Status: DC

## 2018-04-02 NOTE — Telephone Encounter (Signed)
Copied from CRM [id number]. Topic: Quick Communication - Rx Refill/Question >> Apr 02, 2018  1:17 PM Maia Petties wrote: Medication: potassium chloride (KLOR-CON 10) 10 MEQ tablet - pt does not have any left and isn't sure how long she's been out or if she should continue. Scheduled OV 04/03/2018 with Dr. Carmelia Roller.  Pt using [facility] for 90 day supplies for ongoing medications  Has the patient contacted their pharmacy? Yes - no refills Preferred Pharmacy (with phone number or street name): [id number] - [place], [place] - [address] [phone] (Phone) [phone] (Fax)

## 2018-04-02 NOTE — Telephone Encounter (Signed)
Sent in to [facility] 

## 2018-04-03 ENCOUNTER — Ambulatory Visit (INDEPENDENT_AMBULATORY_CARE_PROVIDER_SITE_OTHER): Payer: Medicare HMO | Admitting: Family Medicine

## 2018-04-03 ENCOUNTER — Encounter: Payer: Self-pay | Admitting: Family Medicine

## 2018-04-03 VITALS — BP 122/72 | HR 74 | Temp 97.8°F | Ht 62.0 in | Wt 148.5 lb

## 2018-04-03 DIAGNOSIS — R197 Diarrhea, unspecified: Secondary | ICD-10-CM

## 2018-04-03 DIAGNOSIS — M7989 Other specified soft tissue disorders: Secondary | ICD-10-CM | POA: Diagnosis not present

## 2018-04-03 MED ORDER — FUROSEMIDE 40 MG PO TABS: 40.0000 mg | ORAL_TABLET | Freq: Every day | ORAL | 0 refills | Status: DC

## 2018-04-03 NOTE — Progress Notes (Signed)
Chief Complaint  Patient presents with  . Edema    Whitney Ramirez here for bilateral leg swelling.  Has hx of CKD and LE edema, responded well with Lasix. Had run out. Has gained a few lbs and feels more bloated in abd region.   Has been having diarrhea also for past couple days. No fevers, bleeding, N/V, sick contacts.   ROS:  MSK- +leg swelling, no pain Lungs- no SOB  Past Medical History:  Diagnosis Date  . Allergic rhinitis   . Aortic valve disorder   . Arthritis   . CKD (chronic kidney disease)   . Diabetes mellitus (HCC)   . Hyperlipemia   . Hypertension   . LVH (left ventricular hypertrophy)   . Mass   . Occlusion and stenosis of carotid artery   . Peripheral vascular disease (HCC)   . Vitamin D deficiency       Current Outpatient Medications:  .  atorvastatin (LIPITOR) 40 MG tablet, TAKE 1 TABLET EVERY DAY AT 6 PM, Disp: 90 tablet, Rfl: 1 .  Calcium Carbonate-Vitamin D (CALTRATE 600+D) 600-400 MG-UNIT per chew tablet, Chew 1 tablet by mouth 2 (two) times daily., Disp: , Rfl:  .  COD LIVER OIL PO, Take by mouth daily., Disp: , Rfl:  .  donepezil (ARICEPT) 5 MG tablet, Take 1 tablet (5 mg total) by mouth at bedtime., Disp: 30 tablet, Rfl: 2 .  fish oil-omega-3 fatty acids 1000 MG capsule, Take 1 g by mouth daily., Disp: , Rfl:  .  folic acid (FOLVITE) 1 MG tablet, Take 1 tablet by mouth daily., Disp: , Rfl:  .  furosemide (LASIX) 40 MG tablet, Take 1 tablet (40 mg total) by mouth daily., Disp: 90 tablet, Rfl: 0 .  labetalol (NORMODYNE) 100 MG tablet, TAKE 1 TABLET TWICE DAILY, Disp: 180 tablet, Rfl: 0 .  lisinopril (PRINIVIL,ZESTRIL) 20 MG tablet, TAKE 1 TABLET EVERY DAY, Disp: 90 tablet, Rfl: 0 .  Multiple Vitamins-Minerals (MULTIVITAMIN PO), Take by mouth daily., Disp: , Rfl:  .  potassium chloride (KLOR-CON 10) 10 MEQ tablet, Take 2 tablets (20 mEq total) by mouth daily. Take with the Lasix., Disp: 180 tablet, Rfl: 0  BP 122/72 (BP Location: Left Arm, Patient  Position: Sitting, Cuff Size: Normal)   Pulse 74   Temp 97.8 F (36.6 C) (Oral)   Ht 5\' 2"  (1.575 m)   Wt 148 lb 8 oz (67.4 kg)   SpO2 98%   BMI 27.16 kg/m  Gen- awake, alert, appears stated age Heart- RRR, no murmurs, +LE edema ABD- BS+, soft, NT, moderately distended Lungs- CTAB, normal effort w/o accessory muscle use MSK- no calf pain Psych: Age appropriate judgment and insight  Localized swelling of both lower extremities - Plan: furosemide (LASIX) 40 MG tablet  Diarrhea, unspecified type  Refill Lasix.  Potassium has also been refilled.  Stay active. Supportive care for diarrhea.  If still having after we can, schedule appointment and we will consider reexamination in addition to stool studies. F/u prn. Pt voiced understanding and agreement to the plan.  Jilda Roche [place], DO 04/03/18  11:19 AM

## 2018-04-03 NOTE — Patient Instructions (Addendum)
Go back on your water pill with potassium. I think that could help your stomach and thus sleeping.  If you are still having diarrhea by Monday, I want to see you. Stay hydrated. Drink Pedialyte, Gatorade or Powerade while having diarrhea.   Let us know if you need anything.

## 2018-04-04 ENCOUNTER — Other Ambulatory Visit: Payer: Self-pay | Admitting: Family Medicine

## 2018-04-04 DIAGNOSIS — M7989 Other specified soft tissue disorders: Secondary | ICD-10-CM

## 2018-04-07 ENCOUNTER — Other Ambulatory Visit: Payer: Self-pay | Admitting: Family Medicine

## 2018-04-07 DIAGNOSIS — M7989 Other specified soft tissue disorders: Secondary | ICD-10-CM

## 2018-04-07 MED ORDER — FUROSEMIDE 40 MG PO TABS: 40.0000 mg | ORAL_TABLET | Freq: Every day | ORAL | 0 refills | Status: DC

## 2018-04-07 NOTE — Telephone Encounter (Signed)
Copied from CRM [id number]. Topic: Quick Communication - Rx Refill/Question >> Apr 07, 2018 10:32 AM Arlyss Gandy, NT wrote: Medication: furosemide (LASIX) 40 MG tablet   Enough to last until [facility] gets her meds to her per daughter.   Has the patient contacted their pharmacy? Yes.   (Agent: If no, request that the patient contact the pharmacy for the refill.) (Agent: If yes, when and what did the pharmacy advise?)  Preferred Pharmacy (with phone number or street name): [id number] - [place], [place] - [address] [phone] (Phone) [phone] (Fax)    Agent: Please be advised that RX refills may take up to 3 business days. We ask that you follow-up with your pharmacy.

## 2018-04-07 NOTE — Telephone Encounter (Signed)
Filled until [facility] gets meds to patient Requested Prescriptions  Pending Prescriptions Disp Refills  . furosemide (LASIX) 40 MG tablet 10 tablet 0    Sig: Take 1 tablet (40 mg total) by mouth daily.     Cardiovascular:  Diuretics - Loop Passed - 04/07/2018 10:36 AM      Passed - K in normal range and within 360 days    Potassium  Date Value Ref Range Status  01/23/2018 3.9 3.5 - 5.1 mEq/L Final         Passed - Ca in normal range and within 360 days    Calcium  Date Value Ref Range Status  01/23/2018 9.0 8.4 - 10.5 mg/dL Final         Passed - Na in normal range and within 360 days    Sodium  Date Value Ref Range Status  01/23/2018 141 135 - 145 mEq/L Final         Passed - Cr in normal range and within 360 days    Creatinine, Ser  Date Value Ref Range Status  01/23/2018 0.95 0.40 - 1.20 mg/dL Final         Passed - Last BP in normal range    BP Readings from Last 1 Encounters:  04/03/18 122/72         Passed - Valid encounter within last 6 months    Recent Outpatient Visits          4 days ago Localized swelling of both lower extremities   [facility] at [facility], [place], DO   3 weeks ago Mild cognitive impairment   Hol[facility] representative at [facility], [place], [place]   2 months ago Localized swelling of both lower extremities   [facility] representative at [facility], [place], [place]   5 months ago Impacted cerumen of right Archivist at [facility], [place], [place]   5 months ago Essential hypertension   Holiday representative at [facility], [place], [place]

## 2018-04-15 ENCOUNTER — Encounter: Payer: Self-pay | Admitting: Family Medicine

## 2018-04-15 ENCOUNTER — Ambulatory Visit (INDEPENDENT_AMBULATORY_CARE_PROVIDER_SITE_OTHER): Payer: Medicare HMO | Admitting: Family Medicine

## 2018-04-15 VITALS — BP 140/82 | HR 83 | Temp 97.9°F | Ht 62.0 in | Wt 152.5 lb

## 2018-04-15 DIAGNOSIS — I272 Pulmonary hypertension, unspecified: Secondary | ICD-10-CM

## 2018-04-15 DIAGNOSIS — R197 Diarrhea, unspecified: Secondary | ICD-10-CM

## 2018-04-15 DIAGNOSIS — M7989 Other specified soft tissue disorders: Secondary | ICD-10-CM

## 2018-04-15 MED ORDER — FUROSEMIDE 40 MG PO TABS: 40.0000 mg | ORAL_TABLET | Freq: Two times a day (BID) | ORAL | 1 refills | Status: AC

## 2018-04-15 MED ORDER — POTASSIUM CHLORIDE ER 10 MEQ PO TBCR: EXTENDED_RELEASE_TABLET | ORAL | 1 refills | Status: AC

## 2018-04-15 NOTE — Progress Notes (Signed)
Chief Complaint  Patient presents with  . Diarrhea  . Bloated    Subjective: Patient is a 85 y.o. female here for diarrhea and bloating.  Patient is gained 4 pounds since her last visit.  She is having swelling in her legs and shortness of breath when she lays down.  She takes 40 mg of Lasix in the morning and around half an hour later will start to urinate frequently.  This continued throughout the morning but seems to decrease by the afternoon.  She is not very physically active.  No chest pain.  Patient has been having diarrhea for the last several weeks.  Of note, she did start Aricept on 03/16/2018.  She denies any blood in her stool, fevers, abdominal pain, or nausea/vomiting.  ROS: Heart: Denies chest pain  Lungs: Denies current SOB   Past Medical History:  Diagnosis Date  . Allergic rhinitis   . Aortic valve disorder   . Arthritis   . CKD (chronic kidney disease)   . Diabetes mellitus (HCC)   . Hyperlipemia   . Hypertension   . LVH (left ventricular hypertrophy)   . Mass   . Occlusion and stenosis of carotid artery   . Peripheral vascular disease (HCC)   . Vitamin D deficiency     Objective: BP 140/82 (BP Location: Left Arm, Patient Position: Sitting, Cuff Size: Normal)   Pulse 83   Temp 97.9 F (36.6 C) (Oral)   Ht 5\' 2"  (1.575 m)   Wt 152 lb 8 oz (69.2 kg)   SpO2 96%   BMI 27.89 kg/m  General: Awake, appears stated age HEENT: MMM, EOMi Heart: RRR, 3+ pitting bilateral lower extremity edema up to the knees Lungs: CTAB, no rales, wheezes or rhonchi. No accessory muscle use Abdomen: Bowel sounds present, mildly distended, no masses or organomegaly Psych: Age appropriate judgment and insight, normal affect and mood  Assessment and Plan: Localized swelling of both lower extremities - Plan: furosemide (LASIX) 40 MG tablet, potassium chloride (K-DUR) 10 MEQ tablet  Pulmonary hypertension (HCC)  Diarrhea, unspecified type  We will add in afternoon dose of  Lasix, take 2 tabs of potassium with each dose.  Elevate legs, mind salt intake, stay physically active. For the diarrhea, I believe this is related to her medication.  She will stop taking this and let us know how she does.  This does not sound infectious based off of her history. Follow-up in 1 week to recheck. The patient voiced understanding and agreement to the plan.  Jilda Roche [place], DO 04/15/18  12:05 PM

## 2018-04-15 NOTE — Patient Instructions (Addendum)
I think the diarrhea is related to your new medicine (Aricept). Please stop it.   Keep the diet clean and stay active. Elevate your legs. Mind your salt intake.  We are now going to take 2 tabs of the Lasix daily. I think this will help you urinate more in the afternoon. Take the potassium with hit.   Let us know if you need anything.

## 2018-04-22 ENCOUNTER — Encounter: Payer: Self-pay | Admitting: Family Medicine

## 2018-04-22 ENCOUNTER — Ambulatory Visit (INDEPENDENT_AMBULATORY_CARE_PROVIDER_SITE_OTHER): Payer: Medicare HMO | Admitting: Family Medicine

## 2018-04-22 ENCOUNTER — Other Ambulatory Visit: Payer: Self-pay | Admitting: Family Medicine

## 2018-04-22 VITALS — BP 140/80 | HR 60 | Temp 97.9°F | Ht 62.0 in | Wt 152.5 lb

## 2018-04-22 DIAGNOSIS — M799 Soft tissue disorder, unspecified: Secondary | ICD-10-CM

## 2018-04-22 DIAGNOSIS — H6121 Impacted cerumen, right ear: Secondary | ICD-10-CM | POA: Diagnosis not present

## 2018-04-22 DIAGNOSIS — M7989 Other specified soft tissue disorders: Secondary | ICD-10-CM | POA: Diagnosis not present

## 2018-04-22 DIAGNOSIS — R7989 Other specified abnormal findings of blood chemistry: Secondary | ICD-10-CM

## 2018-04-22 LAB — BASIC METABOLIC PANEL
BUN: 30 mg/dL — ABNORMAL HIGH (ref 6–23)
CO2: 24 mEq/L (ref 19–32)
Calcium: 9 mg/dL (ref 8.4–10.5)
Chloride: 105 mEq/L (ref 96–112)
Creatinine, Ser: 1.22 mg/dL — ABNORMAL HIGH (ref 0.40–1.20)
GFR: 50.66 mL/min — ABNORMAL LOW (ref >60.00)
Glucose, Bld: 81 mg/dL (ref 70–99)
POTASSIUM: 4.4 meq/L (ref 3.5–5.1)
Sodium: 140 mEq/L (ref 135–145)

## 2018-04-22 NOTE — Patient Instructions (Addendum)
Take the second Lasix around 3:30 in the afternoon.   Stay active, elevate your legs when possible, mind the salt intake.  Give [place] 2-3 business days to get the results of your labs back.   Let me know if you want to get an ultrasound of the area on your neck. I do not think it is sinister.  OK to use Debrox (peroxide) in the ear to loosen up wax. Also recommend using a bulb syringe (for removing boogers from baby's noses) to flush through warm water. Do not use Q-tips as this can impact wax further.  Let us know if you need anything.

## 2018-04-22 NOTE — Progress Notes (Signed)
Chief Complaint  Patient presents with  . Follow-up    Subjective: Patient is a 85 y.o. female here for f/u swelling.  Increased Lasix to afternoon dose 1 week ago. Feels sob in evening before bed. Takes 2nd dose around 230 PM, first dose around 9-930 AM. Goes to bed around 9-930 PM.  No weight changes since last visit.  Feels less bloated.  She also notes a growth under her chin.  It was noticed 4 days ago.  No injury or change in activity.  No pain or drainage.  Has ear fullness on the right side.  Thinks it may be wax.  ROS: HEENT: +R ear fullness Lungs: Denies SOB   Past Medical History:  Diagnosis Date  . Allergic rhinitis   . Aortic valve disorder   . Arthritis   . CKD (chronic kidney disease)   . Diabetes mellitus (HCC)   . Hyperlipemia   . Hypertension   . LVH (left ventricular hypertrophy)   . Mass   . Occlusion and stenosis of carotid artery   . Peripheral vascular disease (HCC)   . Vitamin D deficiency     Objective: BP 140/80 (BP Location: Left Arm, Patient Position: Sitting, Cuff Size: Normal)   Pulse 60   Temp 97.9 F (36.6 C) (Oral)   Ht 5\' 2"  (1.575 m)   Wt 152 lb 8 oz (69.2 kg)   SpO2 94%   BMI 27.89 kg/m  General: Awake, appears stated age HEENT: MMM, EOMi, R ear 100% obstructed w cerumen, L ear w some cerumen Heart: RRR, 2+ pitting LE edema up to knees Lungs: CTAB, no rales, wheezes or rhonchi. No accessory muscle use Skin: There is a fleshy area under her chin.  It is centrally located and easily movable.  It is not tender to palpation nor is there any fluctuance, drainage, excessive warmth, or erythema. Psych: Age appropriate judgment and insight, normal affect and mood  Procedure note: Cerumen removal irrigation; right ear Verbal consent obtained. A curette with a light source was used. Cerumen successfully removed. Pt reported improvement. Pt tolerated procedure well. There were no immediate complications noted.   Assessment and  Plan: Localized swelling of both lower extremities - Plan: Basic metabolic panel  Soft tissue complaint  Impacted cerumen of right ear  Continue Lasix, elevate legs, compression stockings, stay as active as possible.  Recheck labs.  Recommend she take her Lasix later in the afternoon. Watchful waiting for the issue under her chin.  It does not feel like anything sinister.  Offered ultrasound if it changes.  Feels like a lipoma.  She does have one on her upper extremity. Cerumen successfully removed.  Home management also recommended and written down in her AVS. Follow-up in 3 months. The patient voiced understanding and agreement to the plan.  Jilda Roche [place], DO 04/22/18  4:01 PM

## 2018-04-24 ENCOUNTER — Other Ambulatory Visit: Payer: Medicare HMO

## 2018-04-27 ENCOUNTER — Other Ambulatory Visit: Payer: Medicare HMO

## 2018-05-03 DEATH — deceased

## 2018-05-05 ENCOUNTER — Telehealth: Payer: Self-pay | Admitting: Family Medicine

## 2018-05-05 NOTE — Telephone Encounter (Addendum)
Received a phone call from Dr. Marthann Schiller representing [facility]. Informed that pt expired last Jul 22, 2022. Pt died at [facility] Dr Ashley Royalty wanted Dr Carmelia Roller to know. Please update pt's 1 st contact on DPR to be daughter Whitney Ramirez [phone]

## 2018-08-18 IMAGING — DX DG ANKLE COMPLETE 3+V*R*
3 series · 3 of 3 positions shown · non-contrast
Comparison: None.

CLINICAL DATA: Right lower extremity swelling for 1 day. No
reported injury.

EXAM:
RIGHT ANKLE - COMPLETE 3+ VIEW

[ankle ap]
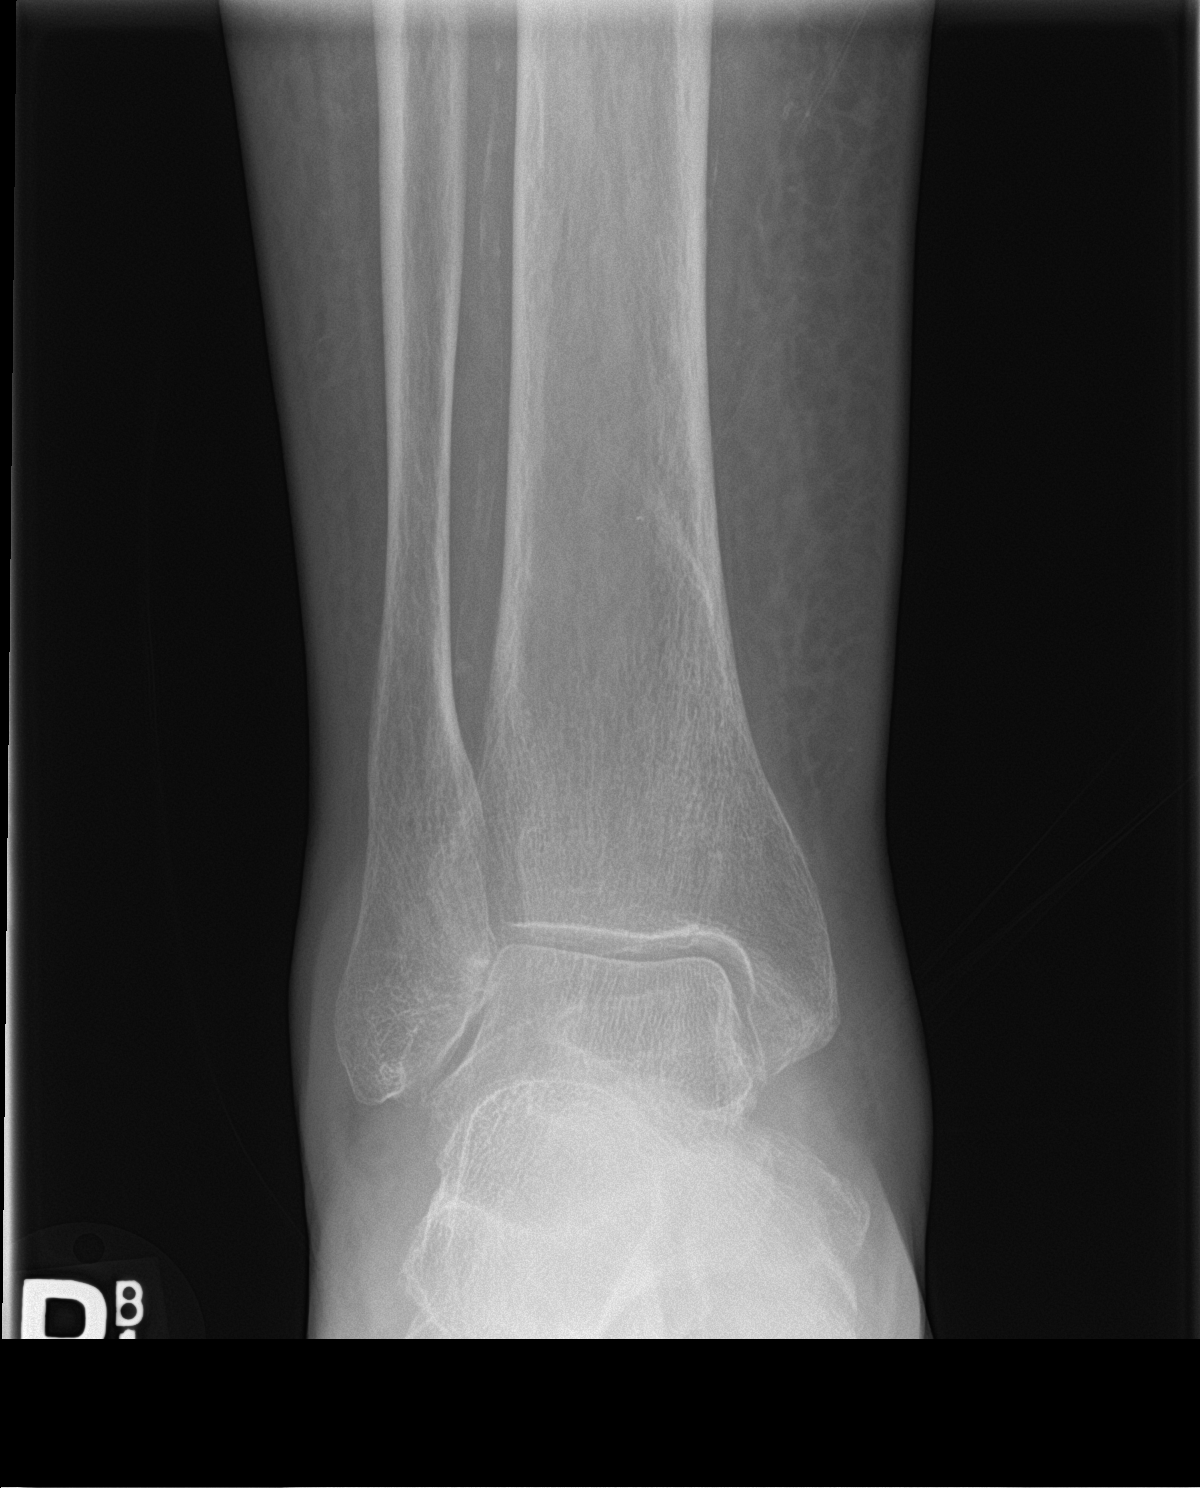

[ankle lat]
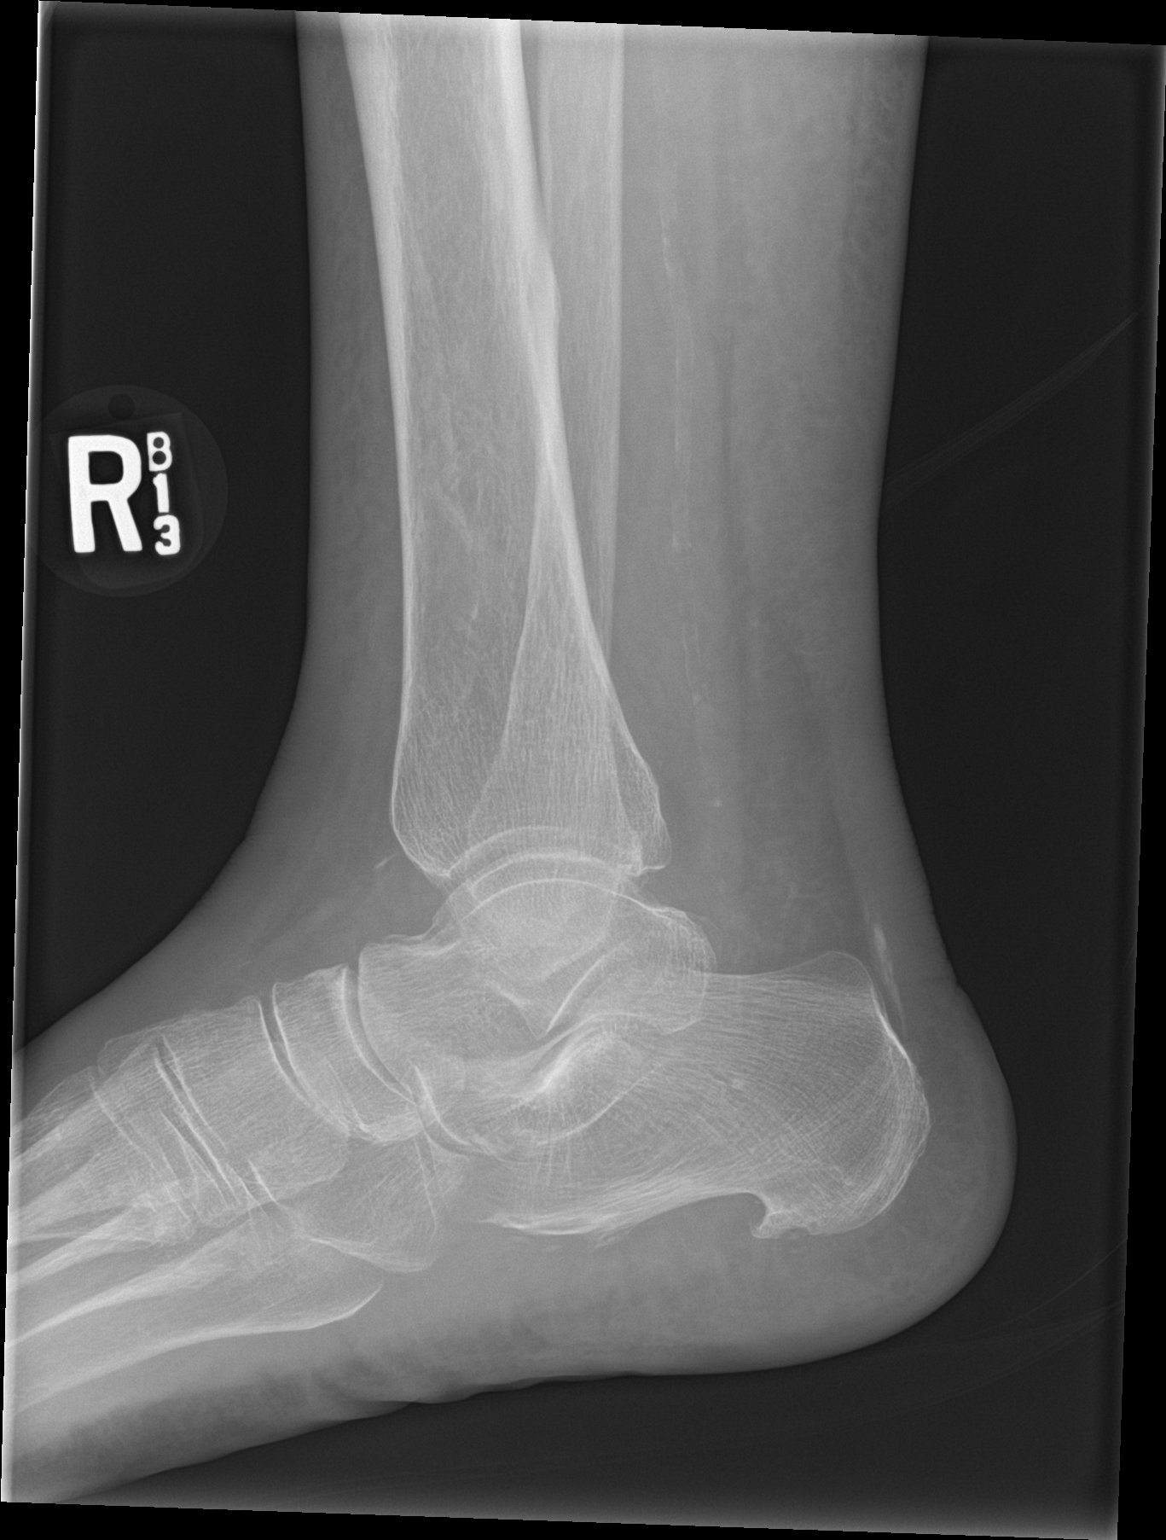

[ankle obl]
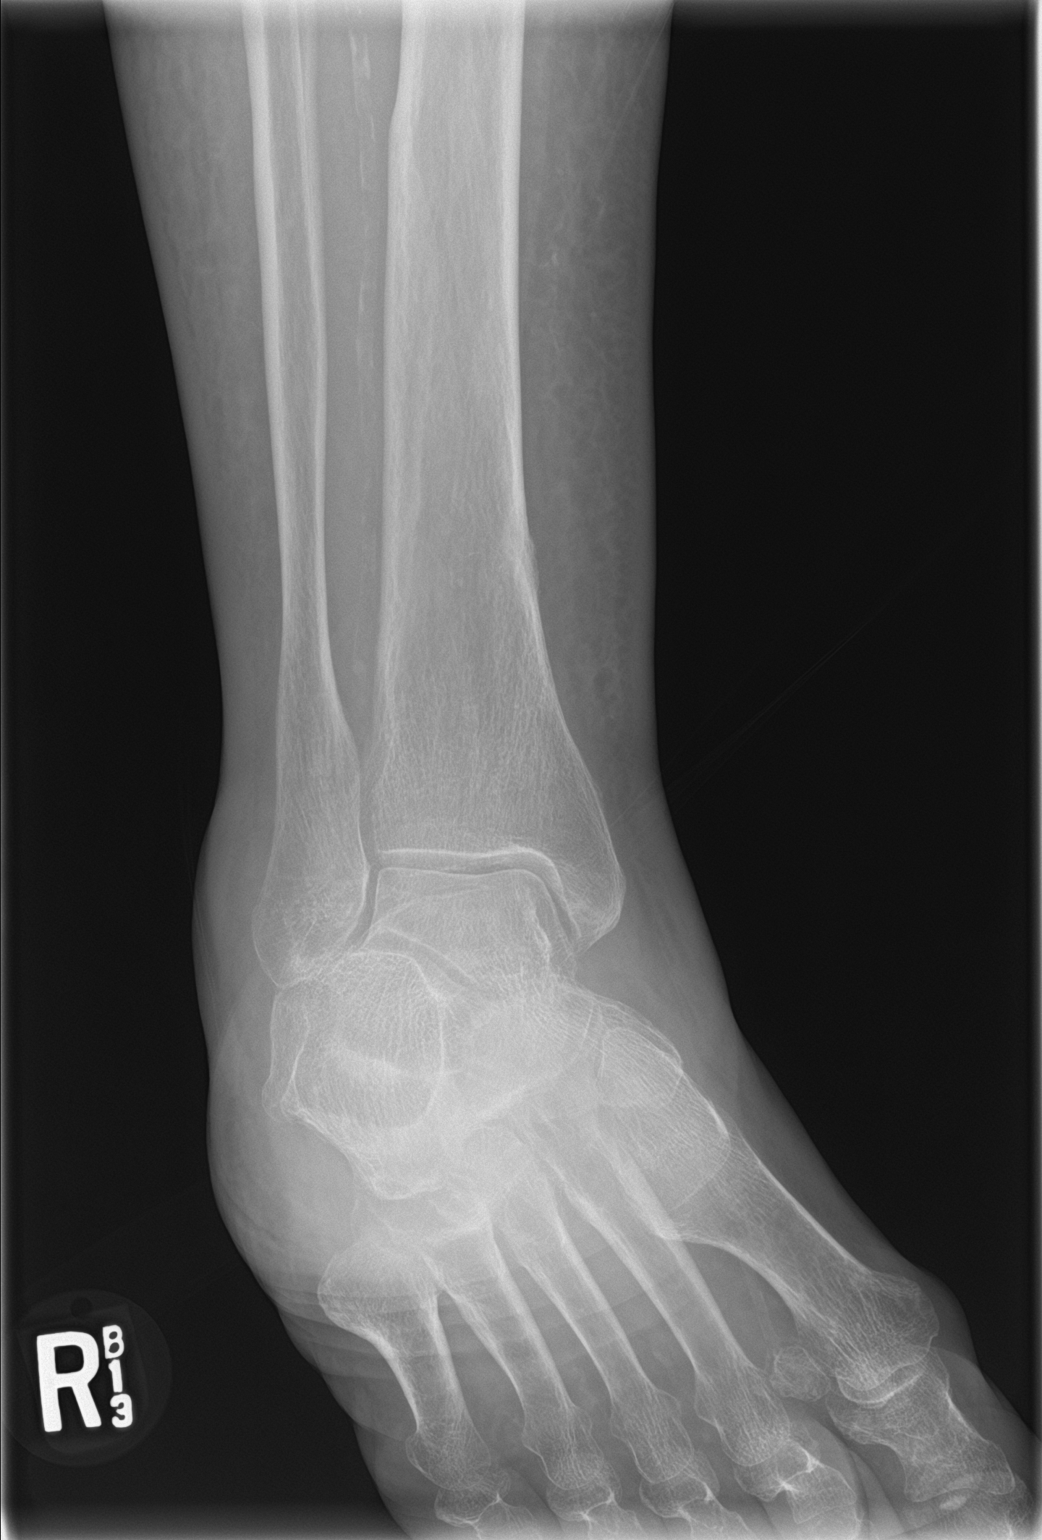

[3 of 3 positions shown; findings below may reference images not displayed]

FINDINGS: Moderate diffuse soft tissue swelling. No fracture or subluxation.
No osseous erosions or periosteal reaction. Moderate Achilles and
small plantar right calcaneal spurs. No suspicious focal osseous
lesions. No radiopaque foreign bodies. Vascular calcifications are
seen in the soft tissues.
IMPRESSION: Moderate diffuse right ankle soft tissue swelling, with no acute
osseous abnormality. Calcaneal spurring.

## 2019-03-24 IMAGING — US US EXTREM LOW VENOUS*R*
1 series · 13 of 24 positions shown · non-contrast
Comparison: None.

CLINICAL DATA: Right lower extremity pain and swelling.



[Series 1: us extrem low venous*right* · 0.08mm/px · 25 acquisitions, 13 frames shown]
[im 1/25]
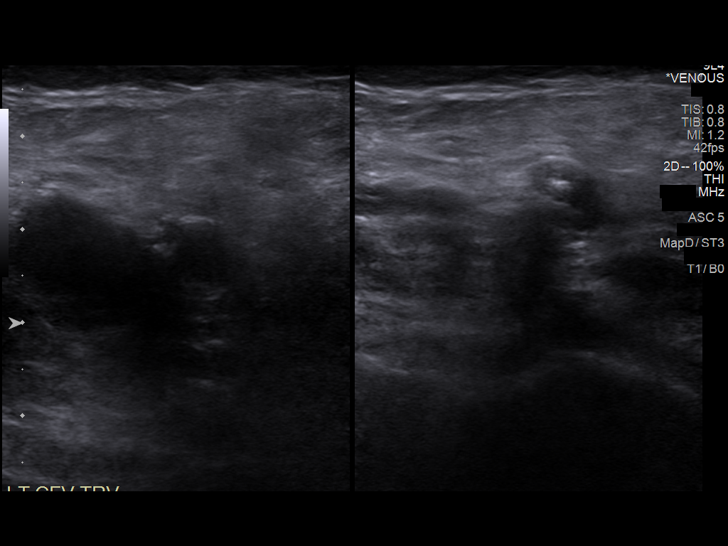
[im 3/25]
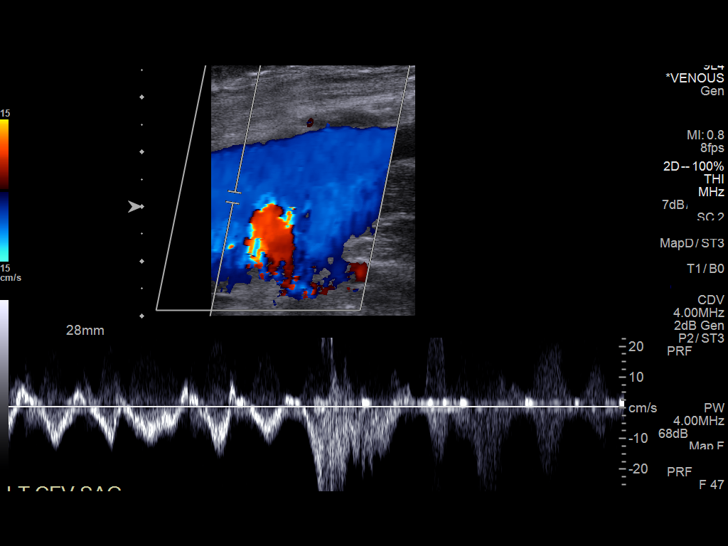
[im 5/25]
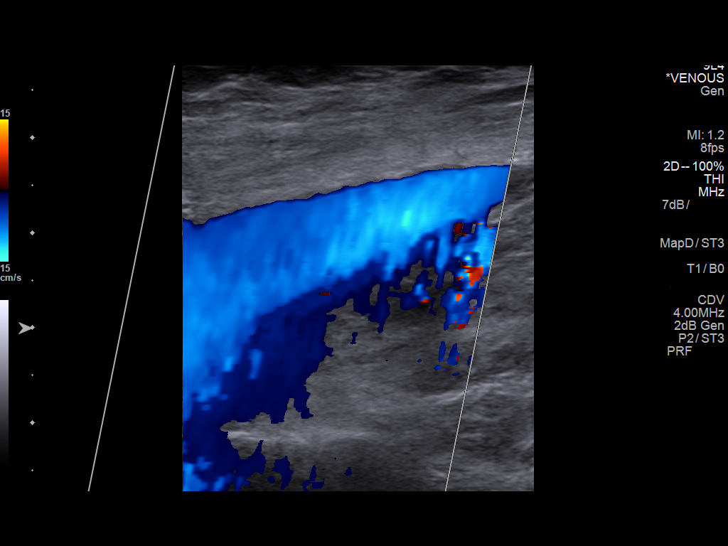
[im 7/25]
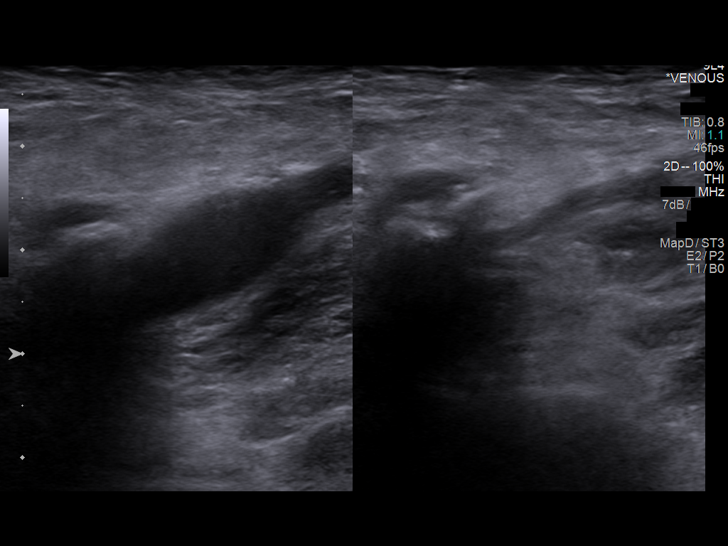
[im 9/25]
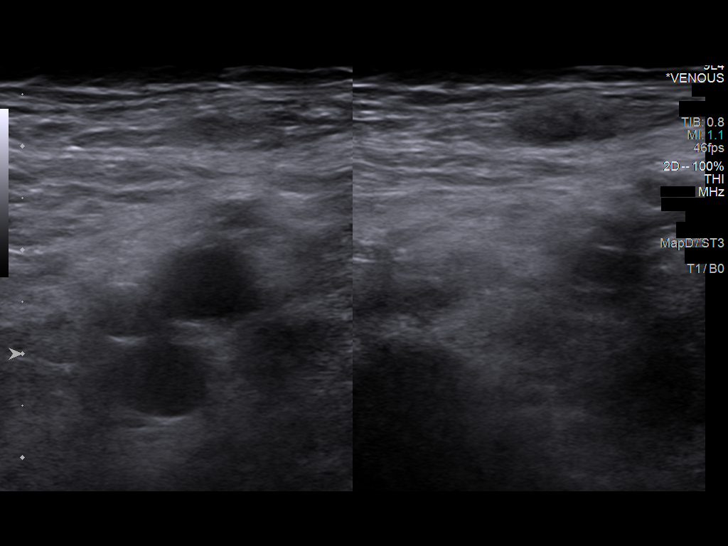
[im 11/25]
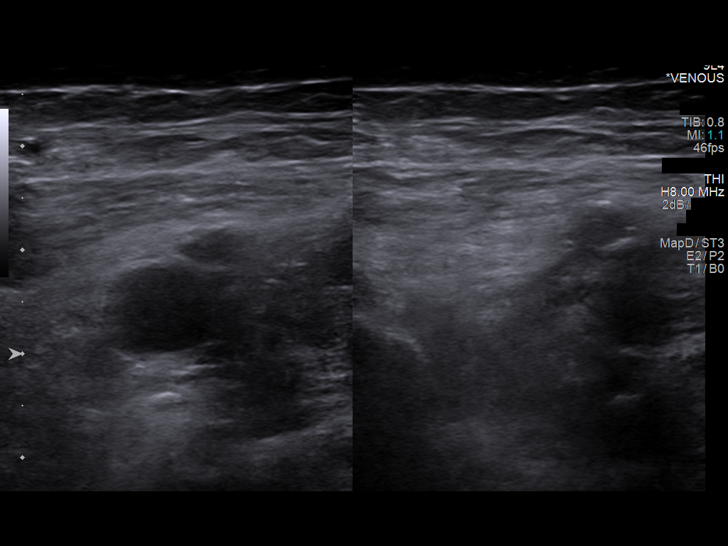
[im 14/25]
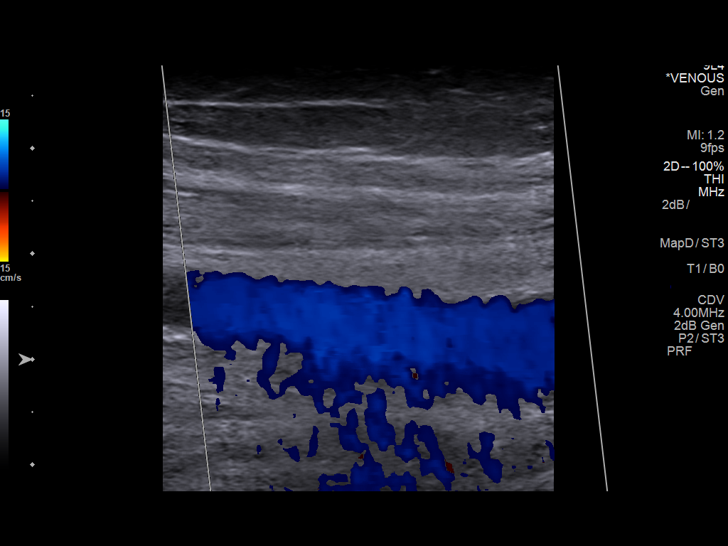
[im 15/25]
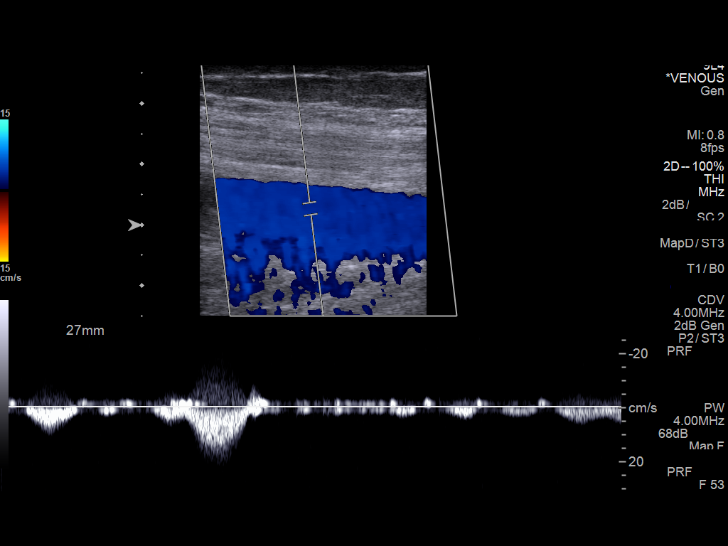
[im 17/25]
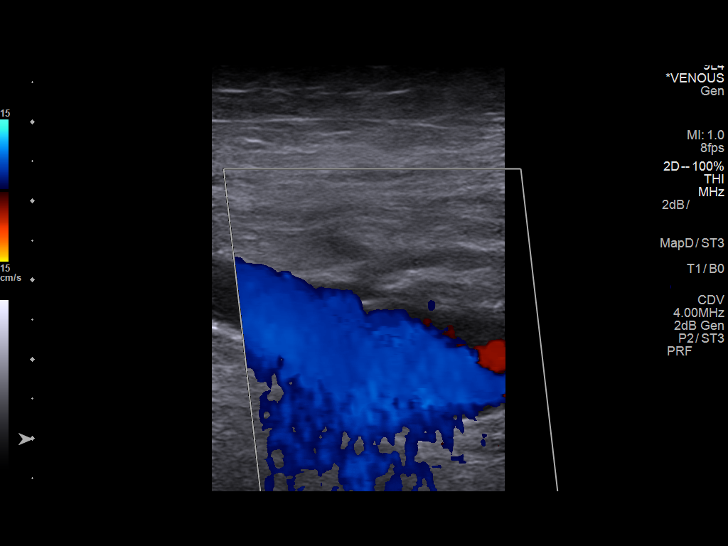
[im 19/25]
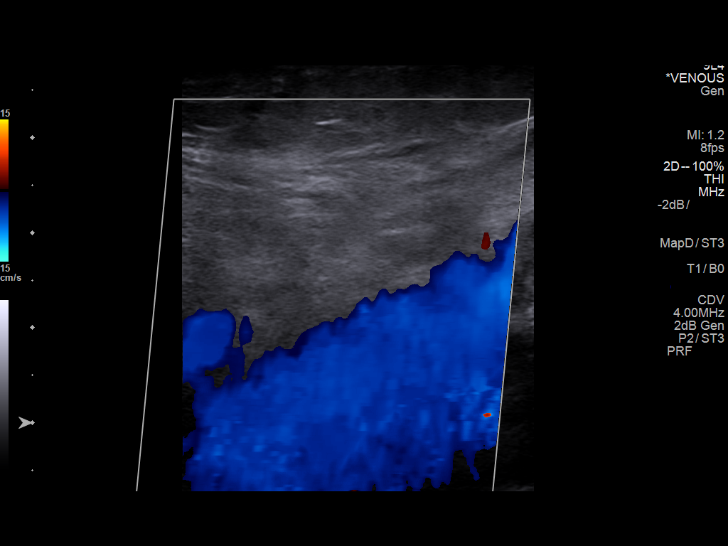
[im 21/25]
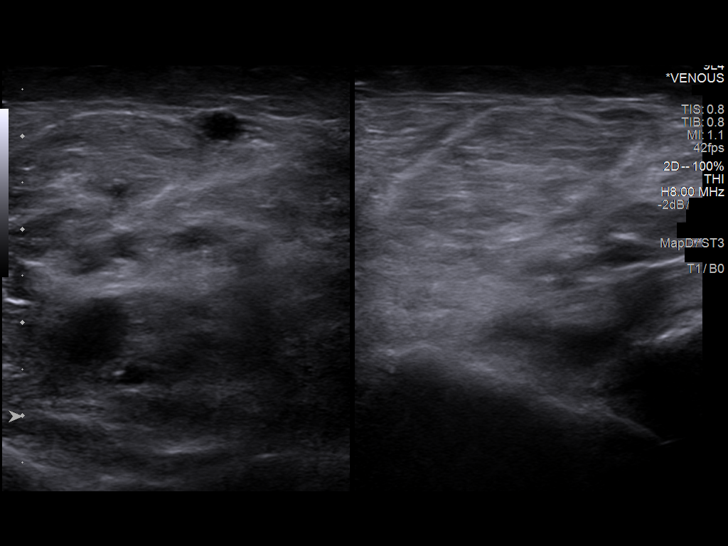
[im 23/25]
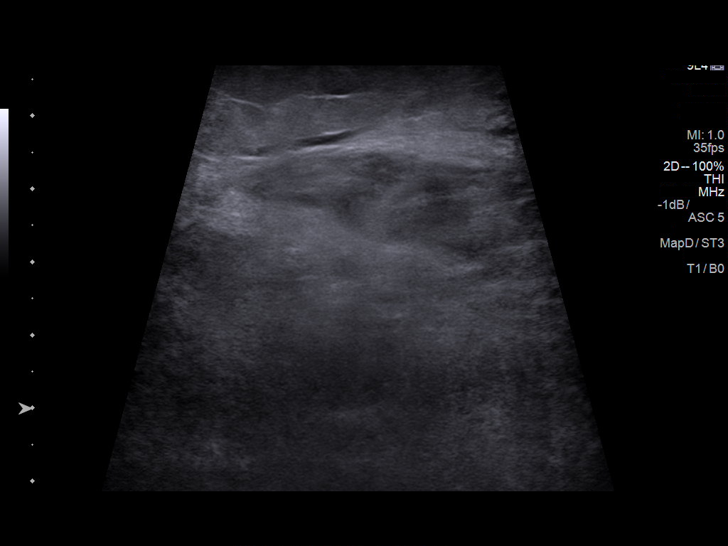
[im 25/25]
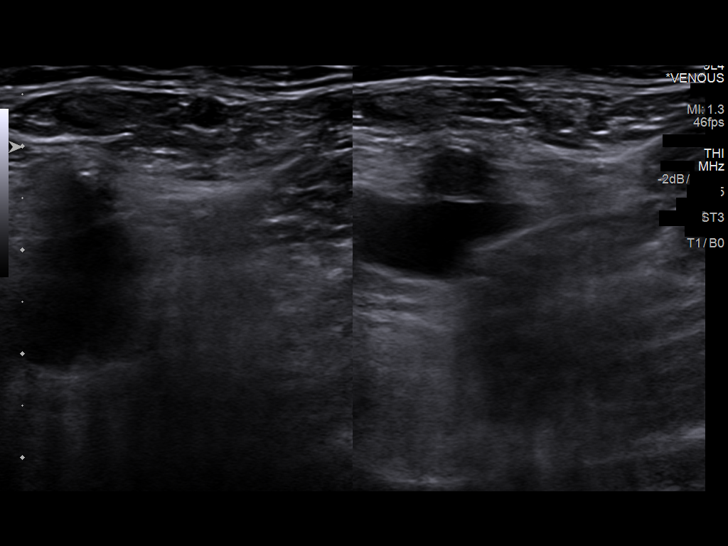

[13 of 24 positions shown; findings below may reference images not displayed]

FINDINGS: Contralateral Common Femoral Vein: Respiratory phasicity is normal
and symmetric with the symptomatic side. No evidence of thrombus.
Normal compressibility.

Common Femoral Vein: No evidence of thrombus. Normal
compressibility, respiratory phasicity and response to augmentation.

Saphenofemoral Junction: No evidence of thrombus. Normal
compressibility and flow on color Doppler imaging.

Profunda Femoral Vein: No evidence of thrombus. Normal
compressibility and flow on color Doppler imaging.

Femoral Vein: No evidence of thrombus. Normal compressibility,
respiratory phasicity and response to augmentation.

Popliteal Vein: No evidence of thrombus. Normal compressibility,
respiratory phasicity and response to augmentation.

Calf Veins: No evidence of thrombus. Normal compressibility and flow
on color Doppler imaging.

Superficial Great Saphenous Vein: No evidence of thrombus. Normal
compressibility.

Venous Reflux:  None.

Other Findings:  None.
IMPRESSION: No evidence of deep venous thrombosis in the right lower extremity.
